# Patient Record
Sex: Female | Born: 1940
Health system: Southern US, Community
[De-identification: ages and names within clinical notes are randomized; demographics above are authoritative.]

## PROBLEM LIST (undated history)

## (undated) DIAGNOSIS — F0150 Vascular dementia without behavioral disturbance: Secondary | ICD-10-CM

---

## 2017-04-14 DIAGNOSIS — G301 Alzheimer's disease with late onset: Secondary | ICD-10-CM | POA: Diagnosis not present

## 2017-04-14 DIAGNOSIS — R69 Illness, unspecified: Secondary | ICD-10-CM | POA: Diagnosis not present

## 2017-05-24 DIAGNOSIS — R69 Illness, unspecified: Secondary | ICD-10-CM | POA: Diagnosis not present

## 2017-06-25 DIAGNOSIS — G301 Alzheimer's disease with late onset: Secondary | ICD-10-CM | POA: Diagnosis not present

## 2017-06-25 DIAGNOSIS — R69 Illness, unspecified: Secondary | ICD-10-CM | POA: Diagnosis not present

## 2017-06-25 DIAGNOSIS — Z23 Encounter for immunization: Secondary | ICD-10-CM | POA: Diagnosis not present

## 2017-06-25 DIAGNOSIS — M25571 Pain in right ankle and joints of right foot: Secondary | ICD-10-CM | POA: Diagnosis not present

## 2017-09-08 DIAGNOSIS — G301 Alzheimer's disease with late onset: Secondary | ICD-10-CM | POA: Diagnosis not present

## 2017-09-08 DIAGNOSIS — R69 Illness, unspecified: Secondary | ICD-10-CM | POA: Diagnosis not present

## 2017-10-08 DIAGNOSIS — R69 Illness, unspecified: Secondary | ICD-10-CM | POA: Diagnosis not present

## 2017-10-08 DIAGNOSIS — G301 Alzheimer's disease with late onset: Secondary | ICD-10-CM | POA: Diagnosis not present

## 2017-10-08 DIAGNOSIS — M2550 Pain in unspecified joint: Secondary | ICD-10-CM | POA: Diagnosis not present

## 2017-11-29 DIAGNOSIS — R69 Illness, unspecified: Secondary | ICD-10-CM | POA: Diagnosis not present

## 2018-03-21 DIAGNOSIS — Z9181 History of falling: Secondary | ICD-10-CM | POA: Diagnosis not present

## 2018-03-21 DIAGNOSIS — Z823 Family history of stroke: Secondary | ICD-10-CM | POA: Diagnosis not present

## 2018-03-21 DIAGNOSIS — G309 Alzheimer's disease, unspecified: Secondary | ICD-10-CM | POA: Diagnosis not present

## 2018-03-21 DIAGNOSIS — Z8249 Family history of ischemic heart disease and other diseases of the circulatory system: Secondary | ICD-10-CM | POA: Diagnosis not present

## 2018-03-21 DIAGNOSIS — R69 Illness, unspecified: Secondary | ICD-10-CM | POA: Diagnosis not present

## 2018-03-21 DIAGNOSIS — Z833 Family history of diabetes mellitus: Secondary | ICD-10-CM | POA: Diagnosis not present

## 2018-03-21 DIAGNOSIS — F0280 Dementia in other diseases classified elsewhere without behavioral disturbance: Secondary | ICD-10-CM | POA: Diagnosis not present

## 2018-03-21 DIAGNOSIS — F419 Anxiety disorder, unspecified: Secondary | ICD-10-CM | POA: Diagnosis not present

## 2018-03-21 DIAGNOSIS — R030 Elevated blood-pressure reading, without diagnosis of hypertension: Secondary | ICD-10-CM | POA: Diagnosis not present

## 2018-03-29 DIAGNOSIS — G300 Alzheimer's disease with early onset: Secondary | ICD-10-CM | POA: Diagnosis not present

## 2018-03-29 DIAGNOSIS — Z6826 Body mass index (BMI) 26.0-26.9, adult: Secondary | ICD-10-CM | POA: Diagnosis not present

## 2018-03-29 DIAGNOSIS — E782 Mixed hyperlipidemia: Secondary | ICD-10-CM | POA: Diagnosis not present

## 2018-04-04 DIAGNOSIS — Z6826 Body mass index (BMI) 26.0-26.9, adult: Secondary | ICD-10-CM | POA: Diagnosis not present

## 2018-04-04 DIAGNOSIS — G300 Alzheimer's disease with early onset: Secondary | ICD-10-CM | POA: Diagnosis not present

## 2018-04-04 DIAGNOSIS — N3001 Acute cystitis with hematuria: Secondary | ICD-10-CM | POA: Diagnosis not present

## 2018-04-04 DIAGNOSIS — N390 Urinary tract infection, site not specified: Secondary | ICD-10-CM | POA: Diagnosis not present

## 2018-05-05 DIAGNOSIS — G309 Alzheimer's disease, unspecified: Secondary | ICD-10-CM | POA: Diagnosis not present

## 2018-06-16 DIAGNOSIS — G309 Alzheimer's disease, unspecified: Secondary | ICD-10-CM | POA: Diagnosis not present

## 2018-07-13 DIAGNOSIS — R21 Rash and other nonspecific skin eruption: Secondary | ICD-10-CM | POA: Diagnosis not present

## 2018-07-13 DIAGNOSIS — Z23 Encounter for immunization: Secondary | ICD-10-CM | POA: Diagnosis not present

## 2018-07-13 DIAGNOSIS — Z6824 Body mass index (BMI) 24.0-24.9, adult: Secondary | ICD-10-CM | POA: Diagnosis not present

## 2018-07-29 DIAGNOSIS — G309 Alzheimer's disease, unspecified: Secondary | ICD-10-CM | POA: Diagnosis not present

## 2018-08-08 DIAGNOSIS — R69 Illness, unspecified: Secondary | ICD-10-CM | POA: Diagnosis not present

## 2018-11-02 DIAGNOSIS — G309 Alzheimer's disease, unspecified: Secondary | ICD-10-CM | POA: Diagnosis not present

## 2019-12-11 ENCOUNTER — Other Ambulatory Visit: Payer: Self-pay

## 2019-12-11 ENCOUNTER — Emergency Department (HOSPITAL_COMMUNITY)
Admission: EM | Admit: 2019-12-11 | Discharge: 2019-12-11 | Disposition: A | Discharge status: Home / Self Care | Payer: Medicare HMO | Attending: Emergency Medicine | Admitting: Emergency Medicine

## 2019-12-11 ENCOUNTER — Encounter (HOSPITAL_COMMUNITY): Payer: Self-pay | Admitting: Emergency Medicine

## 2019-12-11 DIAGNOSIS — R4689 Other symptoms and signs involving appearance and behavior: Secondary | ICD-10-CM | POA: Diagnosis present

## 2019-12-11 DIAGNOSIS — F0391 Unspecified dementia with behavioral disturbance: Secondary | ICD-10-CM | POA: Insufficient documentation

## 2019-12-11 LAB — URINALYSIS, ROUTINE W REFLEX MICROSCOPIC
Bilirubin Urine: NEGATIVE
Glucose, UA: NEGATIVE mg/dL
Hgb urine dipstick: NEGATIVE
Ketones, ur: NEGATIVE mg/dL
Leukocytes,Ua: NEGATIVE
Nitrite: NEGATIVE
Protein, ur: NEGATIVE mg/dL
Specific Gravity, Urine: 1.01 (ref 1.005–1.030)
pH: 7 (ref 5.0–8.0)

## 2019-12-11 LAB — CBC
HCT: 43.9 % (ref 36.0–46.0)
Hemoglobin: 13.9 g/dL (ref 12.0–15.0)
MCH: 29.2 pg (ref 26.0–34.0)
MCHC: 31.7 g/dL (ref 30.0–36.0)
MCV: 92.2 fL (ref 80.0–100.0)
Platelets: 210 10*3/uL (ref 150–400)
RBC: 4.76 MIL/uL (ref 3.87–5.11)
RDW: 12.9 % (ref 11.5–15.5)
WBC: 6.1 10*3/uL (ref 4.0–10.5)
nRBC: 0 % (ref 0.0–0.2)

## 2019-12-11 LAB — BASIC METABOLIC PANEL
Anion gap: 8 (ref 5–15)
BUN: 11 mg/dL (ref 8–23)
CO2: 27 mmol/L (ref 22–32)
Calcium: 8.9 mg/dL (ref 8.9–10.3)
Chloride: 105 mmol/L (ref 98–111)
Creatinine, Ser: 0.85 mg/dL (ref 0.44–1.00)
GFR calc Af Amer: >60 mL/min (ref >60)
GFR calc non Af Amer: >60 mL/min (ref >60)
Glucose, Bld: 102 mg/dL — ABNORMAL HIGH (ref 70–99)
Potassium: 4.2 mmol/L (ref 3.5–5.1)
Sodium: 140 mmol/L (ref 135–145)

## 2019-12-11 NOTE — ED Notes (Signed)
Pt without complaints at this time. Will continue to monitor.

## 2019-12-11 NOTE — ED Notes (Signed)
Pt ambulated back to bed with steady gait.

## 2019-12-11 NOTE — ED Notes (Signed)
PTAR arrived to get patient

## 2019-12-11 NOTE — ED Notes (Signed)
Pt provided dinner tray.

## 2019-12-11 NOTE — Progress Notes (Signed)
CSW spoke to EDP who states pt is from [facility] and is now calm, resting and ready for D/C.  CSW spoke to pt's RN who states she will be calling report shortly.  CSW will continue to follow for D/C needs.  Alphonse Guild. Tammara Massing, LCSW, LCAS, CSI Transitions of Care Clinical Social Worker Care Coordination Department Ph: [phone]

## 2019-12-11 NOTE — ED Notes (Signed)
Update provided to nursing home.

## 2019-12-11 NOTE — Discharge Instructions (Signed)
It was our pleasure to provide your ER care today - we hope that you feel better.  Follow up with primary care doctor in the next couple days - discuss possible medication adjustment.   Return to ER if worse, new symptoms, fevers, trouble breathing, or other medical emergency.

## 2019-12-11 NOTE — ED Provider Notes (Signed)
[facility] DEPT Provider Note   CSN: [id number] Arrival date & time: 12/11/19  1514     History Chief Complaint  Patient presents with  . Aggressive Behavior  . Dementia    Tracy Jenkins is a 79 y.o. female.  Patient from [facility] via EMS, pt with hx dementia, was noted with agitated/aggressive behavior earlier. Currently patient is calm and cooperative. Patient very limited historian, advanced dementia - level 5 caveat. Patient denies any c/o currently. No pain or discomfort. Denies being depressed, angry or upset. Denies thoughts of harm to self or others.   The history is provided by the patient and the EMS personnel. The history is limited by the condition of the patient.       History reviewed. No pertinent past medical history.  There are no problems to display for this patient.   History reviewed. No pertinent surgical history.   OB History   No obstetric history on file.     History reviewed. No pertinent family history.  Social History   Tobacco Use  . Smoking status: Not on file  Substance Use Topics  . Alcohol use: Not on file  . Drug use: Not on file    Home Medications Prior to Admission medications   Not on File    Allergies    Patient has no allergy information on record.  Review of Systems   Review of Systems  Constitutional: Negative for fever.  HENT: Negative for sore throat.   Eyes: Negative for visual disturbance.  Respiratory: Negative for shortness of breath.   Cardiovascular: Negative for chest pain.  Gastrointestinal: Negative for abdominal pain.  Genitourinary: Negative for dysuria.  Musculoskeletal: Negative for back pain and neck pain.  Skin: Negative for rash.  Neurological: Negative for headaches.  Hematological: Does not bruise/bleed easily.  Psychiatric/Behavioral: Positive for confusion.    Physical Exam Updated Vital Signs BP (!) 146/62   Pulse 76   Temp 98.4 F (36.9 C) (Oral)   Resp  15   SpO2 96%   Physical Exam Vitals and nursing note reviewed.  Constitutional:      Appearance: Normal appearance. She is well-developed.  HENT:     Head: Atraumatic.     Nose: Nose normal.     Mouth/Throat:     Mouth: Mucous membranes are moist.  Eyes:     General: No scleral icterus.    Conjunctiva/sclera: Conjunctivae normal.     Pupils: Pupils are equal, round, and reactive to light.  Neck:     Trachea: No tracheal deviation.  Cardiovascular:     Rate and Rhythm: Normal rate and regular rhythm.     Pulses: Normal pulses.     Heart sounds: Normal heart sounds. No murmur. No friction rub. No gallop.   Pulmonary:     Effort: Pulmonary effort is normal. No respiratory distress.     Breath sounds: Normal breath sounds.  Abdominal:     General: Bowel sounds are normal. There is no distension.     Palpations: Abdomen is soft.     Tenderness: There is no abdominal tenderness. There is no guarding.  Genitourinary:    Comments: No cva tenderness.  Musculoskeletal:        General: No swelling.     Cervical back: Normal range of motion and neck supple. No rigidity. No muscular tenderness.  Skin:    General: Skin is warm and dry.     Findings: No rash.  Neurological:  Mental Status: She is alert.     Comments: Alert, speech normal. Motor/sens grossly intact bil.   Psychiatric:        Mood and Affect: Mood normal.     Comments: Smiling, alert, cooperative.      ED Results / Procedures / Treatments   Labs (all labs ordered are listed, but only abnormal results are displayed) Results for orders placed or performed during the hospital encounter of 12/11/19  CBC  Result Value Ref Range   WBC 6.1 4.0 - 10.5 K/uL   RBC 4.76 3.87 - 5.11 MIL/uL   Hemoglobin 13.9 12.0 - 15.0 g/dL   HCT 43.9 36.0 - 46.0 %   MCV 92.2 80.0 - 100.0 fL   MCH 29.2 26.0 - 34.0 pg   MCHC 31.7 30.0 - 36.0 g/dL   RDW 12.9 11.5 - 15.5 %   Platelets 210 150 - 400 K/uL   nRBC 0.0 0.0 - 0.2 %  Basic  metabolic panel  Result Value Ref Range   Sodium 140 135 - 145 mmol/L   Potassium 4.2 3.5 - 5.1 mmol/L   Chloride 105 98 - 111 mmol/L   CO2 27 22 - 32 mmol/L   Glucose, Bld 102 (H) 70 - 99 mg/dL   BUN 11 8 - 23 mg/dL   Creatinine, Ser 0.85 0.44 - 1.00 mg/dL   Calcium 8.9 8.9 - 10.3 mg/dL   GFR calc non Af Amer >60 >60 mL/min   GFR calc Af Amer >60 >60 mL/min   Anion gap 8 5 - 15  Urinalysis, Routine w reflex microscopic  Result Value Ref Range   Color, Urine STRAW (A) YELLOW   APPearance CLEAR CLEAR   Specific Gravity, Urine 1.010 1.005 - 1.030   pH 7.0 5.0 - 8.0   Glucose, UA NEGATIVE NEGATIVE mg/dL   Hgb urine dipstick NEGATIVE NEGATIVE   Bilirubin Urine NEGATIVE NEGATIVE   Ketones, ur NEGATIVE NEGATIVE mg/dL   Protein, ur NEGATIVE NEGATIVE mg/dL   Nitrite NEGATIVE NEGATIVE   Leukocytes,Ua NEGATIVE NEGATIVE    EKG None  Radiology No results found.  Procedures Procedures (including critical care time)  Medications Ordered in ED Medications - No data to display  ED Course  I have reviewed the triage vital signs and the nursing notes.  Pertinent labs & imaging results that were available during my care of the patient were reviewed by me and considered in my medical decision making (see chart for details).    MDM Rules/Calculators/A&P                      Labs sent.   Reviewed nursing notes and prior charts for additional history.   Suspect patient with episodic behavior disturbance(s) related to chronic dementia. Patient remains alert, content, and cooperative in ED.   Po fluids. Tolerates well. No new c/o.  Labs reviewed/interpreted by me - chem normal. UA neg for infection.   Patient remains calm, smiling, alert, no aggressive behavior in ED.   Patient appears stable for d/c back to ECF.        Final Clinical Impression(s) / ED Diagnoses Final diagnoses:  None    Rx / DC Orders ED Discharge Orders    None       Lajean Saver,  MD 12/11/19 1656

## 2019-12-11 NOTE — ED Notes (Signed)
Pt ambulated to BR; gait steady

## 2019-12-11 NOTE — ED Notes (Signed)
PTAR notified of need for pt transport.  Dispatch reported that there were "9-10" ahead of pt to be picked up for transport.
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
## 2020-03-08 ENCOUNTER — Emergency Department (HOSPITAL_COMMUNITY): Payer: Medicare HMO

## 2020-03-08 ENCOUNTER — Encounter (HOSPITAL_COMMUNITY): Payer: Self-pay | Admitting: *Deleted

## 2020-03-08 ENCOUNTER — Other Ambulatory Visit: Payer: Self-pay

## 2020-03-08 ENCOUNTER — Emergency Department (HOSPITAL_COMMUNITY)
Admission: EM | Admit: 2020-03-08 | Discharge: 2020-03-08 | Disposition: A | Discharge status: Home / Self Care | Payer: Medicare HMO | Attending: Emergency Medicine | Admitting: Emergency Medicine

## 2020-03-08 DIAGNOSIS — F0151 Vascular dementia with behavioral disturbance: Secondary | ICD-10-CM | POA: Insufficient documentation

## 2020-03-08 DIAGNOSIS — W19XXXA Unspecified fall, initial encounter: Secondary | ICD-10-CM | POA: Diagnosis not present

## 2020-03-08 DIAGNOSIS — Y999 Unspecified external cause status: Secondary | ICD-10-CM | POA: Diagnosis not present

## 2020-03-08 DIAGNOSIS — S161XXA Strain of muscle, fascia and tendon at neck level, initial encounter: Secondary | ICD-10-CM

## 2020-03-08 DIAGNOSIS — Y939 Activity, unspecified: Secondary | ICD-10-CM | POA: Insufficient documentation

## 2020-03-08 DIAGNOSIS — S0990XA Unspecified injury of head, initial encounter: Secondary | ICD-10-CM

## 2020-03-08 DIAGNOSIS — Z043 Encounter for examination and observation following other accident: Secondary | ICD-10-CM | POA: Insufficient documentation

## 2020-03-08 DIAGNOSIS — Y9289 Other specified places as the place of occurrence of the external cause: Secondary | ICD-10-CM | POA: Diagnosis not present

## 2020-03-08 HISTORY — DX: Vascular dementia, unspecified severity, without behavioral disturbance, psychotic disturbance, mood disturbance, and anxiety: F01.50

## 2020-03-08 NOTE — ED Provider Notes (Signed)
[facility] DEPT Provider Note   CSN: [id number] Arrival date & time: 03/08/20  0140     History Chief Complaint  Patient presents with  . Fall    Tracy Jenkins is a 79 y.o. female.  Patient is a 79 year old female with history of dementia presenting with complaints of fall.  Patient lives in a memory care center.  She was found today on the floor after what appears to be an unwitnessed fall.  Patient has very little history secondary to dementia.  The history is provided by the patient.  Fall This is a new problem. The current episode started less than 1 hour ago. The problem occurs constantly. The problem has not changed since onset.      Past Medical History:  Diagnosis Date  . 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   No past surgical history on file.   OB History   No obstetric history on file.     No family history on file.  Social History   Tobacco Use  . Smoking status: Not on file  Substance Use Topics  . Alcohol use: Not on file  . Drug use: Not on file    Home Medications Prior to Admission medications   Not on File    Allergies    Patient has no known allergies.  Review of Systems   Review of Systems  Unable to perform ROS: Dementia    Physical Exam Updated Vital Signs BP 134/75 (BP Location: Right Arm)   Pulse (!) 56   Temp 98.1 F (36.7 C) (Oral)   Resp 20   SpO2 98%   Physical Exam Vitals and nursing note reviewed.  Constitutional:      General: She is not in acute distress.    Appearance: She is well-developed. She is not diaphoretic.  HENT:     Head: Normocephalic and atraumatic.  Cardiovascular:     Rate and Rhythm: Normal rate and regular rhythm.     Heart sounds: No murmur heard.  No friction rub. No gallop.   Pulmonary:     Effort: Pulmonary effort is normal. No respiratory distress.     Breath sounds: Normal breath sounds. No wheezing.    Abdominal:     General: Bowel sounds are normal. There is no distension.     Palpations: Abdomen is soft.     Tenderness: There is no abdominal tenderness.  Musculoskeletal:        General: Normal range of motion.     Cervical back: Normal range of motion and neck supple.     Comments: She has full range of motion of all 4 extremities.  There is no pain with palpation of the pelvis or rotation of the hips.  Skin:    General: Skin is warm and dry.  Neurological:     General: No focal deficit present.     Mental Status: She is alert and oriented to person, place, and time.     Cranial Nerves: No cranial nerve deficit.     Sensory: No sensory deficit.     Motor: No weakness.     Coordination: Coordination normal.     ED Results / Procedures / Treatments   Labs (all labs ordered are listed, but only abnormal results are displayed) Labs Reviewed - No data to display  EKG None  Radiology No results found.  Procedures Procedures (including critical care time)  Medications  Ordered in ED Medications - No data to display  ED Course  I have reviewed the triage vital signs and the nursing notes.  Pertinent labs & imaging results that were available during my care of the patient were reviewed by me and considered in my medical decision making (see chart for details).    MDM Rules/Calculators/A&P  Patient presents here ambulance after a fall.  She was found on the floor of her memory care center with a bump on her head.  She appears confused, but I believe this to be her baseline.  She otherwise appears neurologically intact.  Head CT and cervical spine CT both negative.  At this point patient seems appropriate for discharge.  Final Clinical Impression(s) / ED Diagnoses Final diagnoses:  None    Rx / DC Orders ED Discharge Orders    None       Veryl Speak, MD 03/08/20 [phone]

## 2020-03-08 NOTE — Discharge Instructions (Addendum)
Continue medications as previously prescribed.

## 2020-03-08 NOTE — ED Notes (Signed)
PTAR notified for transport 

## 2020-03-08 NOTE — ED Triage Notes (Signed)
Pt arrives from [facility] facility where she sustained an unwitnessed fall. Found on ground by staff in restroom. Pt has hematoma to posterior left side of the head, c/o upper back pain. C collar placed for transport. Hx of dementia, at baseline per staff. VSS en route.

## 2020-05-15 ENCOUNTER — Encounter (HOSPITAL_COMMUNITY): Payer: Self-pay

## 2020-05-15 ENCOUNTER — Other Ambulatory Visit: Payer: Self-pay

## 2020-05-15 DIAGNOSIS — F0390 Unspecified dementia without behavioral disturbance: Secondary | ICD-10-CM | POA: Diagnosis not present

## 2020-05-15 DIAGNOSIS — Z79899 Other long term (current) drug therapy: Secondary | ICD-10-CM | POA: Diagnosis not present

## 2020-05-15 DIAGNOSIS — E860 Dehydration: Secondary | ICD-10-CM | POA: Insufficient documentation

## 2020-05-15 DIAGNOSIS — R4182 Altered mental status, unspecified: Secondary | ICD-10-CM | POA: Diagnosis present

## 2020-05-15 DIAGNOSIS — Z20822 Contact with and (suspected) exposure to covid-19: Secondary | ICD-10-CM | POA: Insufficient documentation

## 2020-05-15 NOTE — ED Triage Notes (Signed)
Pt BIB [facility] from [facility] c/o weakness and AMS since Monday per staff. They state that she has been eating and drinking less and not communicating as much as normal. Hx dementia. No focal neuro symptoms.

## 2020-05-16 ENCOUNTER — Emergency Department (HOSPITAL_COMMUNITY): Payer: Medicare HMO

## 2020-05-16 ENCOUNTER — Emergency Department (HOSPITAL_COMMUNITY)
Admission: EM | Admit: 2020-05-16 | Discharge: 2020-05-16 | Disposition: A | Discharge status: Skilled Nursing Facility | Payer: Medicare HMO | Attending: Emergency Medicine | Admitting: Emergency Medicine

## 2020-05-16 DIAGNOSIS — F0390 Unspecified dementia without behavioral disturbance: Secondary | ICD-10-CM | POA: Diagnosis not present

## 2020-05-16 DIAGNOSIS — E860 Dehydration: Secondary | ICD-10-CM

## 2020-05-16 LAB — VALPROIC ACID LEVEL: Valproic Acid Lvl: 40 ug/mL — ABNORMAL LOW (ref 50.0–100.0)

## 2020-05-16 LAB — COMPREHENSIVE METABOLIC PANEL
ALT: 50 U/L — ABNORMAL HIGH (ref 0–44)
AST: 46 U/L — ABNORMAL HIGH (ref 15–41)
Albumin: 3.5 g/dL (ref 3.5–5.0)
Alkaline Phosphatase: 38 U/L (ref 38–126)
Anion gap: 9 (ref 5–15)
BUN: 15 mg/dL (ref 8–23)
CO2: 32 mmol/L (ref 22–32)
Calcium: 9.1 mg/dL (ref 8.9–10.3)
Chloride: 102 mmol/L (ref 98–111)
Creatinine, Ser: 0.73 mg/dL (ref 0.44–1.00)
GFR calc Af Amer: >60 mL/min (ref >60)
GFR calc non Af Amer: >60 mL/min (ref >60)
Glucose, Bld: 106 mg/dL — ABNORMAL HIGH (ref 70–99)
Potassium: 3.5 mmol/L (ref 3.5–5.1)
Sodium: 143 mmol/L (ref 135–145)
Total Bilirubin: 0.5 mg/dL (ref 0.3–1.2)
Total Protein: 6.4 g/dL — ABNORMAL LOW (ref 6.5–8.1)

## 2020-05-16 LAB — URINALYSIS, ROUTINE W REFLEX MICROSCOPIC
Bilirubin Urine: NEGATIVE
Glucose, UA: NEGATIVE mg/dL
Hgb urine dipstick: NEGATIVE
Ketones, ur: 5 mg/dL — AB
Leukocytes,Ua: NEGATIVE
Nitrite: NEGATIVE
Protein, ur: NEGATIVE mg/dL
Specific Gravity, Urine: 1.024 (ref 1.005–1.030)
pH: 5 (ref 5.0–8.0)

## 2020-05-16 LAB — CBC
HCT: 43.7 % (ref 36.0–46.0)
Hemoglobin: 14.1 g/dL (ref 12.0–15.0)
MCH: 30.3 pg (ref 26.0–34.0)
MCHC: 32.3 g/dL (ref 30.0–36.0)
MCV: 93.8 fL (ref 80.0–100.0)
Platelets: 141 10*3/uL — ABNORMAL LOW (ref 150–400)
RBC: 4.66 MIL/uL (ref 3.87–5.11)
RDW: 13.3 % (ref 11.5–15.5)
WBC: 5.1 10*3/uL (ref 4.0–10.5)
nRBC: 0 % (ref 0.0–0.2)

## 2020-05-16 LAB — SARS CORONAVIRUS 2 BY RT PCR (HOSPITAL ORDER, PERFORMED IN ~~LOC~~ HOSPITAL LAB): SARS Coronavirus 2: NEGATIVE

## 2020-05-16 LAB — CBG MONITORING, ED: Glucose-Capillary: 102 mg/dL — ABNORMAL HIGH (ref 70–99)

## 2020-05-16 MED ORDER — SODIUM CHLORIDE 0.9 % IV BOLUS
1000.0000 mL | Freq: Once | INTRAVENOUS | Status: AC
  Administered 2020-05-16: 1000 mL via INTRAVENOUS

## 2020-05-16 NOTE — ED Notes (Signed)
Pt resting comfortably in her triage room

## 2020-05-16 NOTE — ED Notes (Signed)
Attempted to call next of kin. Mr. Tracy Jenkins that patient is being discharged. Voicemail left

## 2020-05-16 NOTE — ED Notes (Signed)
PTAR called  

## 2020-05-16 NOTE — ED Notes (Signed)
Patient assisted to bathroom 

## 2020-05-16 NOTE — ED Provider Notes (Signed)
[facility] DEPT Provider Note   CSN: [id number] Arrival date & time: 05/15/20  2347     History Chief Complaint  Patient presents with  . Altered Mental Status    Tracy Jenkins is a 79 y.o. female.  Pt presents to the ED today from [facility] for [place].  Pt has dementia and said she feels fine.  Per EMS, pt has not been eating and drinking well for the past few days.         Past Medical History:  Diagnosis Date  . 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   History reviewed. No pertinent surgical history.   OB History   No obstetric history on file.     History reviewed. No pertinent family history.  Social History   Tobacco Use  . Smoking status: Not on file  Substance Use Topics  . Alcohol use: Not on file  . Drug use: Not on file    Home Medications Prior to Admission medications   Medication Sig Start Date End Date Taking? Authorizing Provider  acetaminophen (TYLENOL) 500 MG tablet Take 500 mg by mouth every 6 (six) hours as needed for mild pain, fever or headache.   Yes [provider]  alum & mag hydroxide-simeth (MI-ACID) 200-200-20 MG/5ML suspension Take 30 mLs by mouth every 6 (six) hours as needed for indigestion or heartburn.   Yes [provider]  citalopram (CELEXA) 10 MG tablet Take 10 mg by mouth daily.   Yes [provider]  divalproex (DEPAKOTE SPRINKLE) 125 MG capsule Take 250 mg by mouth 3 (three) times daily.   Yes [provider]  guaifenesin (ROBITUSSIN) 100 MG/5ML syrup Take 200 mg by mouth every 6 (six) hours as needed for cough.   Yes [provider]  loperamide (IMODIUM) 2 MG capsule Take 2 mg by mouth as needed for diarrhea or loose stools. Not to exceed 8 doses in 24 hours   Yes [provider]  magnesium hydroxide (MILK OF MAGNESIA) 400 MG/5ML suspension Take 30 mLs by mouth at bedtime as needed for mild  constipation.   Yes [provider]  neomycin-bacitracin-polymyxin (NEOSPORIN) ointment Apply 1 application topically as needed for wound care.   Yes [provider]  QUEtiapine (SEROQUEL) 25 MG tablet Take 25 mg by mouth 2 (two) times daily.   Yes [provider]  traZODone (DESYREL) 50 MG tablet Take 50 mg by mouth at bedtime.   Yes [provider]    Allergies    Patient has no known allergies.  Review of Systems   Review of Systems  Unable to perform ROS: Dementia  All other systems reviewed and are negative.   Physical Exam Updated Vital Signs BP (!) 141/66 (BP Location: Left Arm)   Pulse 63   Temp 97.8 F (36.6 C) (Oral)   Resp 16   SpO2 97%   Physical Exam Vitals and nursing note reviewed.  Constitutional:      Appearance: Normal appearance.  HENT:     Head: Normocephalic and atraumatic.     Right Ear: External ear normal.     Left Ear: External ear normal.     Nose: Nose normal.     Mouth/Throat:     Mouth: Mucous membranes are moist.     Pharynx: Oropharynx is clear.  Eyes:     Extraocular Movements: Extraocular movements intact.     Conjunctiva/sclera: Conjunctivae normal.  Pupils: Pupils are equal, round, and reactive to light.  Cardiovascular:     Rate and Rhythm: Normal rate and regular rhythm.     Pulses: Normal pulses.     Heart sounds: Normal heart sounds.  Pulmonary:     Effort: Pulmonary effort is normal.     Breath sounds: Normal breath sounds.  Abdominal:     General: Abdomen is flat. Bowel sounds are normal.     Palpations: Abdomen is soft.  Musculoskeletal:        General: Normal range of motion.     Cervical back: Normal range of motion and neck supple.  Skin:    General: Skin is warm.     Capillary Refill: Capillary refill takes less than 2 seconds.  Neurological:     General: No focal deficit present.     Mental Status: She is alert.  Psychiatric:        Mood and Affect: Mood normal.         Behavior: Behavior normal.     ED Results / Procedures / Treatments   Labs (all labs ordered are listed, but only abnormal results are displayed) Labs Reviewed  COMPREHENSIVE METABOLIC PANEL - Abnormal; Notable for the following components:      Result Value   Glucose, Bld 106 (*)    Total Protein 6.4 (*)    AST 46 (*)    ALT 50 (*)    All other components within normal limits  CBC - Abnormal; Notable for the following components:   Platelets 141 (*)    All other components within normal limits  URINALYSIS, ROUTINE W REFLEX MICROSCOPIC - Abnormal; Notable for the following components:   Color, Urine AMBER (*)    Ketones, ur 5 (*)    All other components within normal limits  CBG MONITORING, ED - Abnormal; Notable for the following components:   Glucose-Capillary 102 (*)    All other components within normal limits  SARS CORONAVIRUS 2 BY RT PCR (HOSPITAL ORDER, [facility])  VALPROIC ACID LEVEL    EKG None  Radiology DG Chest 2 View  Result Date: 05/16/2020 CLINICAL DATA:  Weakness and altered mental status EXAM: CHEST - 2 VIEW COMPARISON:  None FINDINGS: Images rotated to the RIGHT. Accounting for this rotation cardiomediastinal contours and hilar structures are normal. Lungs are clear.  No sign of pleural effusion. Spinal degenerative changes, limited skeletal assessment otherwise unremarkable. IMPRESSION: No active cardiopulmonary disease. Electronically Signed   By: Zetta Bills M.D.   On: 05/16/2020 08:16   CT Head Wo Contrast  Result Date: 05/16/2020 CLINICAL DATA:  Mental status change with unknown cause EXAM: CT HEAD WITHOUT CONTRAST TECHNIQUE: Contiguous axial images were obtained from the base of the skull through the vertex without intravenous contrast. COMPARISON:  03/08/2020 FINDINGS: Brain: No evidence of acute infarction, hemorrhage, hydrocephalus, extra-axial collection or mass lesion/mass effect. Confluent chronic small vessel  ischemic gliosis in the cerebral white matter. Cerebral volume loss that is prominent, especially at the medial temporal lobes, worse on the left. Vascular: No hyperdense vessel or unexpected calcification. Skull: Normal. Negative for fracture or focal lesion. Sinuses/Orbits: Negative IMPRESSION: 1. No acute or reversible finding. 2. Prominent brain atrophy and extensive chronic white matter disease. Electronically Signed   By: Monte Fantasia M.D.   On: 05/16/2020 07:49    Procedures Procedures (including critical care time)  Medications Ordered in ED Medications  sodium chloride 0.9 % bolus 1,000 mL (1,000 mLs  Intravenous New Bag/Given 05/16/20 0813)    ED Course  I have reviewed the triage vital signs and the nursing notes.  Pertinent labs & imaging results that were available during my care of the patient were reviewed by me and considered in my medical decision making (see chart for details).    MDM Rules/Calculators/A&P                          Pt is ambulatory in the ED.  She has no complaints.  She is given IVFs.  She is drinking fluids if I hold them up to her.  Pt d/w her daughter.    Pt is stable for d/c.  Return if worse.    Final Clinical Impression(s) / ED Diagnoses Final diagnoses:  Dehydration  Dementia without behavioral disturbance, unspecified dementia type [facility])    Rx / [place] Orders ED Discharge Orders    None       Isla Pence, MD 05/16/20 [phone]

## 2020-05-27 ENCOUNTER — Emergency Department (HOSPITAL_COMMUNITY): Payer: Medicare HMO

## 2020-05-27 ENCOUNTER — Emergency Department (HOSPITAL_COMMUNITY)
Admission: EM | Admit: 2020-05-27 | Discharge: 2020-05-28 | Disposition: A | Discharge status: Home / Self Care | Payer: Medicare HMO | Attending: Emergency Medicine | Admitting: Emergency Medicine

## 2020-05-27 ENCOUNTER — Other Ambulatory Visit: Payer: Self-pay

## 2020-05-27 ENCOUNTER — Encounter (HOSPITAL_COMMUNITY): Payer: Self-pay

## 2020-05-27 DIAGNOSIS — Y999 Unspecified external cause status: Secondary | ICD-10-CM | POA: Insufficient documentation

## 2020-05-27 DIAGNOSIS — Y939 Activity, unspecified: Secondary | ICD-10-CM | POA: Insufficient documentation

## 2020-05-27 DIAGNOSIS — Y929 Unspecified place or not applicable: Secondary | ICD-10-CM | POA: Insufficient documentation

## 2020-05-27 DIAGNOSIS — W19XXXA Unspecified fall, initial encounter: Secondary | ICD-10-CM | POA: Diagnosis not present

## 2020-05-27 DIAGNOSIS — S0990XA Unspecified injury of head, initial encounter: Secondary | ICD-10-CM | POA: Insufficient documentation

## 2020-05-27 DIAGNOSIS — Z79899 Other long term (current) drug therapy: Secondary | ICD-10-CM | POA: Insufficient documentation

## 2020-05-27 DIAGNOSIS — F0150 Vascular dementia without behavioral disturbance: Secondary | ICD-10-CM | POA: Insufficient documentation

## 2020-05-27 NOTE — Discharge Instructions (Signed)
You were seen in the emergency department for evaluation of injuries after a fall.  You did not have any obvious sign of injury.  You had a head and cervical spine CT that did not show any obvious injuries.  Please monitor back to your facility for any neurologic symptoms.  Return to the emergency department for any concerns.

## 2020-05-27 NOTE — ED Triage Notes (Signed)
Pt BIB [facility] from [facility]. Staff reports a fall where she hit her head around 7a. No LOC. Pt has advanced dementia and is at baseline. No injury noted. VSS.

## 2020-05-27 NOTE — ED Provider Notes (Signed)
[facility] DEPT Provider Note   CSN: [id number] Arrival date & time: 05/27/20  2136     History Chief Complaint  Patient presents with  . Fall    Tracy Jenkins is a 79 y.o. female.  Level 5 caveat secondary to dementia.  Patient is brought in by EMS from [facility] her memory facility for evaluation of a fall.  They said she hit her head around 7 AM this morning.  No loss of consciousness.  EMS states she is at baseline mental status.  No signs of injury.  Denies complaints although is a limited historian.  The history is provided by the EMS personnel.  Fall This is a recurrent problem. The current episode started 6 to 12 hours ago. The problem has not changed since onset.Pertinent negatives include no chest pain, no abdominal pain, no headaches and no shortness of breath. Nothing aggravates the symptoms. Nothing relieves the symptoms. She has tried nothing for the symptoms. The treatment provided no relief.       Past Medical History:  Diagnosis Date  . 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   History reviewed. No pertinent surgical history.   OB History   No obstetric history on file.     History reviewed. No pertinent family history.  Social History   Tobacco Use  . Smoking status: Not on file  Substance Use Topics  . Alcohol use: Not on file  . Drug use: Not on file    Home Medications Prior to Admission medications   Medication Sig Start Date End Date Taking? Authorizing Provider  acetaminophen (TYLENOL) 500 MG tablet Take 500 mg by mouth every 6 (six) hours as needed for mild pain, fever or headache.    [provider]  alum & mag hydroxide-simeth (MI-ACID) 200-200-20 MG/5ML suspension Take 30 mLs by mouth every 6 (six) hours as needed for indigestion or heartburn.    [provider]  citalopram (CELEXA) 10 MG tablet Take 10 mg by mouth daily.    [provider]  divalproex (DEPAKOTE SPRINKLE) 125 MG capsule Take 250 mg by mouth 3 (three) times daily.    [provider]  guaifenesin (ROBITUSSIN) 100 MG/5ML syrup Take 200 mg by mouth every 6 (six) hours as needed for cough.    [provider]  loperamide (IMODIUM) 2 MG capsule Take 2 mg by mouth as needed for diarrhea or loose stools. Not to exceed 8 doses in 24 hours    [provider]  magnesium hydroxide (MILK OF MAGNESIA) 400 MG/5ML suspension Take 30 mLs by mouth at bedtime as needed for mild constipation.    [provider]  neomycin-bacitracin-polymyxin (NEOSPORIN) ointment Apply 1 application topically as needed for wound care.    [provider]  QUEtiapine (SEROQUEL) 25 MG tablet Take 25 mg by mouth 2 (two) times daily.    [provider]  traZODone (DESYREL) 50 MG tablet Take 50 mg by mouth at bedtime.    [provider]    Allergies    Patient has no known allergies.  Review of Systems   Review of Systems  Unable to perform ROS: Dementia  Respiratory: Negative for shortness of breath.   Cardiovascular: Negative for chest pain.  Gastrointestinal: Negative for abdominal pain.  Neurological: Negative for headaches.    Physical Exam Updated Vital Signs BP 119/62   Pulse (!) 54   Temp 98.1 F (36.7  C)   Resp 20   SpO2 94%   Physical Exam Vitals and nursing note reviewed.  Constitutional:      General: She is not in acute distress.    Appearance: Normal appearance. She is well-developed.  HENT:     Head: Normocephalic and atraumatic.     Comments: No palpable depressions or lacerations noted. Eyes:     Conjunctiva/sclera: Conjunctivae normal.  Cardiovascular:     Rate and Rhythm: Normal rate and regular rhythm.     Pulses: Normal pulses.     Heart sounds: No murmur heard.   Pulmonary:     Effort: Pulmonary effort is normal. No respiratory distress.     Breath sounds: Normal breath sounds.    Abdominal:     Palpations: Abdomen is soft.     Tenderness: There is no abdominal tenderness.  Musculoskeletal:        General: Normal range of motion.     Cervical back: Neck supple. No tenderness.     Comments: She has full range of motion of her upper and lower extremities without any pain or limitations.  No obvious pelvic instability.  No focal cervical thoracic or lumbar spine tenderness.  Skin:    General: Skin is warm and dry.  Neurological:     General: No focal deficit present.     Mental Status: She is alert.     Comments: Patient is arousable to voice and will answer some simple questions.  No obvious facial asymmetry and moving all extremities.     ED Results / Procedures / Treatments   Labs (all labs ordered are listed, but only abnormal results are displayed) Labs Reviewed  CBC WITH DIFFERENTIAL/PLATELET - Abnormal; Notable for the following components:      Result Value   Platelets 142 (*)    All other components within normal limits  BASIC METABOLIC PANEL - Abnormal; Notable for the following components:   Potassium 3.4 (*)    Glucose, Bld 103 (*)    Calcium 8.3 (*)    All other components within normal limits  URINALYSIS, ROUTINE W REFLEX MICROSCOPIC - Abnormal; Notable for the following components:   Color, Urine AMBER (*)    Ketones, ur 5 (*)    All other components within normal limits    EKG EKG Interpretation  Date/Time:  Tuesday May 28 2020 01:09:23 EDT Ventricular Rate:  54 PR Interval:  144 QRS Duration: 92 QT Interval:  458 QTC Calculation: 434 R Axis:   6 Text Interpretation: Sinus bradycardia Possible Anterior infarct , age undetermined Abnormal ECG No prior for comparison Confirmed by Thayer Jew [phone]) on 05/28/2020 2:03:24 AM   Radiology CT Head Wo Contrast  Result Date: 05/27/2020 CLINICAL DATA:  [place] and hit head baseline dementia EXAM: CT HEAD WITHOUT CONTRAST CT CERVICAL SPINE WITHOUT CONTRAST TECHNIQUE: Multidetector CT  imaging of the head and cervical spine was performed following the standard protocol without intravenous contrast. Multiplanar CT image reconstructions of the cervical spine were also generated. COMPARISON:  CT brain 05/16/2020, CT cervical spine 03/08/2020 FINDINGS: CT HEAD FINDINGS Brain: No acute territorial infarction, hemorrhage, or intracranial mass. Moderate atrophy. Marked hypodensity in the white matter consistent with chronic small vessel ischemic change. Stable ventricle size. Vascular: No hyperdense vessels. Scattered carotid vascular calcification Skull: Normal. Negative for fracture or focal lesion. Sinuses/Orbits: No acute finding. Other: None CT CERVICAL SPINE FINDINGS Alignment: Straightening of the cervical spine. No subluxation. Facet alignment is maintained. Skull base and vertebrae: No  acute fracture. No primary bone lesion or focal pathologic process. Soft tissues and spinal canal: No prevertebral fluid or swelling. No visible canal hematoma. Disc levels: Degenerative changes at multiple levels. There is advanced degenerative change at C5-C6. Multiple level facet degenerative change with foraminal stenosis. Upper chest: Negative. Other: None IMPRESSION: 1. No CT evidence for acute intracranial abnormality. Atrophy and chronic small vessel ischemic change of the white matter. 2. Straightening of the cervical spine with degenerative changes. No acute osseous abnormality. Electronically Signed   By: Donavan Foil M.D.   On: 05/27/2020 23:32   CT Cervical Spine Wo Contrast  Result Date: 05/27/2020 CLINICAL DATA:  [place] and hit head baseline dementia EXAM: CT HEAD WITHOUT CONTRAST CT CERVICAL SPINE WITHOUT CONTRAST TECHNIQUE: Multidetector CT imaging of the head and cervical spine was performed following the standard protocol without intravenous contrast. Multiplanar CT image reconstructions of the cervical spine were also generated. COMPARISON:  CT brain 05/16/2020, CT cervical spine 03/08/2020  FINDINGS: CT HEAD FINDINGS Brain: No acute territorial infarction, hemorrhage, or intracranial mass. Moderate atrophy. Marked hypodensity in the white matter consistent with chronic small vessel ischemic change. Stable ventricle size. Vascular: No hyperdense vessels. Scattered carotid vascular calcification Skull: Normal. Negative for fracture or focal lesion. Sinuses/Orbits: No acute finding. Other: None CT CERVICAL SPINE FINDINGS Alignment: Straightening of the cervical spine. No subluxation. Facet alignment is maintained. Skull base and vertebrae: No acute fracture. No primary bone lesion or focal pathologic process. Soft tissues and spinal canal: No prevertebral fluid or swelling. No visible canal hematoma. Disc levels: Degenerative changes at multiple levels. There is advanced degenerative change at C5-C6. Multiple level facet degenerative change with foraminal stenosis. Upper chest: Negative. Other: None IMPRESSION: 1. No CT evidence for acute intracranial abnormality. Atrophy and chronic small vessel ischemic change of the white matter. 2. Straightening of the cervical spine with degenerative changes. No acute osseous abnormality. Electronically Signed   By: Donavan Foil M.D.   On: 05/27/2020 23:32    Procedures Procedures (including critical care time)  Medications Ordered in ED Medications - No data to display  ED Course  I have reviewed the triage vital signs and the nursing notes.  Pertinent labs & imaging results that were available during my care of the patient were reviewed by me and considered in my medical decision making (see chart for details).    MDM Rules/Calculators/A&P                         This patient complains of fall at her long-term care facility; this involves an extensive number of treatment Options and is a complaint that carries with it a high risk of complications and Morbidity. The differential includes fall, head bleed, skull fracture, cervical fracture I  ordered imaging studies which included head and cervical spine CT and I independently    visualized and interpreted imaging which showed no acute findings Additional history obtained from EMS Previous records obtained and reviewed in epic, seen for a fall in June and just 11 days ago for dehydration  After the interventions stated above, I reevaluated the patient and found patient to be sleepy but arousable to voice and will interact.  No focal findings on neurologic exam.  To follow-up on testing and reassess for disposition likely return back to her facility  Final Clinical Impression(s) / ED Diagnoses Final diagnoses:  Fall, initial encounter  Minor head injury, initial encounter    Rx / DC Orders  ED Discharge Orders    None       Hayden Rasmussen, MD 05/28/20 1027

## 2020-05-28 DIAGNOSIS — S0990XA Unspecified injury of head, initial encounter: Secondary | ICD-10-CM | POA: Diagnosis not present

## 2020-05-28 LAB — CBC WITH DIFFERENTIAL/PLATELET
Abs Immature Granulocytes: 0.03 10*3/uL (ref 0.00–0.07)
Basophils Absolute: 0 10*3/uL (ref 0.0–0.1)
Basophils Relative: 1 %
Eosinophils Absolute: 0.1 10*3/uL (ref 0.0–0.5)
Eosinophils Relative: 1 %
HCT: 37.8 % (ref 36.0–46.0)
Hemoglobin: 12.2 g/dL (ref 12.0–15.0)
Immature Granulocytes: 1 %
Lymphocytes Relative: 34 %
Lymphs Abs: 2 10*3/uL (ref 0.7–4.0)
MCH: 30 pg (ref 26.0–34.0)
MCHC: 32.3 g/dL (ref 30.0–36.0)
MCV: 93.1 fL (ref 80.0–100.0)
Monocytes Absolute: 0.9 10*3/uL (ref 0.1–1.0)
Monocytes Relative: 16 %
Neutro Abs: 2.9 10*3/uL (ref 1.7–7.7)
Neutrophils Relative %: 47 %
Platelets: 142 10*3/uL — ABNORMAL LOW (ref 150–400)
RBC: 4.06 MIL/uL (ref 3.87–5.11)
RDW: 13.8 % (ref 11.5–15.5)
WBC: 5.9 10*3/uL (ref 4.0–10.5)
nRBC: 0 % (ref 0.0–0.2)

## 2020-05-28 LAB — URINALYSIS, ROUTINE W REFLEX MICROSCOPIC
Bilirubin Urine: NEGATIVE
Glucose, UA: NEGATIVE mg/dL
Hgb urine dipstick: NEGATIVE
Ketones, ur: 5 mg/dL — AB
Leukocytes,Ua: NEGATIVE
Nitrite: NEGATIVE
Protein, ur: NEGATIVE mg/dL
Specific Gravity, Urine: 1.024 (ref 1.005–1.030)
pH: 6 (ref 5.0–8.0)

## 2020-05-28 LAB — BASIC METABOLIC PANEL
Anion gap: 7 (ref 5–15)
BUN: 21 mg/dL (ref 8–23)
CO2: 30 mmol/L (ref 22–32)
Calcium: 8.3 mg/dL — ABNORMAL LOW (ref 8.9–10.3)
Chloride: 104 mmol/L (ref 98–111)
Creatinine, Ser: 0.52 mg/dL (ref 0.44–1.00)
GFR calc Af Amer: >60 mL/min (ref >60)
GFR calc non Af Amer: >60 mL/min (ref >60)
Glucose, Bld: 103 mg/dL — ABNORMAL HIGH (ref 70–99)
Potassium: 3.4 mmol/L — ABNORMAL LOW (ref 3.5–5.1)
Sodium: 141 mmol/L (ref 135–145)

## 2020-05-28 NOTE — ED Provider Notes (Signed)
Patient signed out pending CT scan.  Reportedly fell at her nursing home without any collateral information.  Unknown whether this was mechanical or syncopal.  CT scans reviewed and showed no evidence of intracranial injury or neck fracture.  On my evaluation, patient is extremely somnolent.  She will localize to pain but will not open her eyes to voice or pain.  Called her nursing facility and they state that she has had a general decline with increased somnolence.  Unknown timeframe.  Because of this, I have added some basic lab work and urine.  Will obtain an EKG as well.  2:03 AM Lab work and EKG reviewed.  Largely unremarkable.  No evidence of urinary tract infection.  No evidence of acute arrhythmia or ischemia.  No significant metabolic derangements.  She remains somnolent but moves all 4 extremities and no focal deficit.  Will discharge back to living facility.  Physical Exam  BP 103/61 (BP Location: Left Arm)   Pulse (!) 57   Temp 98.1 F (36.7 C)   Resp 18   SpO2 95%   Physical Exam   Somnolent, minimally arousable Pupils 6 mm reactive bilaterally Moves all 4 extremities, disoriented  ED Course/Procedures     Procedures  MDM   Problem List Items Addressed This Visit    None    Visit Diagnoses    Fall, initial encounter    -  Primary   Minor head injury, initial encounter                Merryl Hacker, MD 05/28/20 [phone]

## 2020-05-31 ENCOUNTER — Encounter (HOSPITAL_COMMUNITY): Payer: Self-pay | Admitting: Emergency Medicine

## 2020-05-31 ENCOUNTER — Emergency Department (HOSPITAL_COMMUNITY)
Admission: EM | Admit: 2020-05-31 | Discharge: 2020-05-31 | Disposition: A | Discharge status: Skilled Nursing Facility | Payer: Medicare HMO | Attending: Emergency Medicine | Admitting: Emergency Medicine

## 2020-05-31 ENCOUNTER — Emergency Department (HOSPITAL_COMMUNITY): Payer: Medicare HMO

## 2020-05-31 ENCOUNTER — Other Ambulatory Visit: Payer: Self-pay

## 2020-05-31 DIAGNOSIS — Y999 Unspecified external cause status: Secondary | ICD-10-CM | POA: Diagnosis not present

## 2020-05-31 DIAGNOSIS — Z79899 Other long term (current) drug therapy: Secondary | ICD-10-CM | POA: Insufficient documentation

## 2020-05-31 DIAGNOSIS — Y939 Activity, unspecified: Secondary | ICD-10-CM | POA: Diagnosis not present

## 2020-05-31 DIAGNOSIS — S161XXA Strain of muscle, fascia and tendon at neck level, initial encounter: Secondary | ICD-10-CM | POA: Diagnosis not present

## 2020-05-31 DIAGNOSIS — Y929 Unspecified place or not applicable: Secondary | ICD-10-CM | POA: Diagnosis not present

## 2020-05-31 DIAGNOSIS — W19XXXA Unspecified fall, initial encounter: Secondary | ICD-10-CM | POA: Insufficient documentation

## 2020-05-31 DIAGNOSIS — S0990XA Unspecified injury of head, initial encounter: Secondary | ICD-10-CM | POA: Diagnosis not present

## 2020-05-31 NOTE — ED Notes (Signed)
Pt given sandwich and water at this time

## 2020-05-31 NOTE — ED Notes (Signed)
Attempted to call report to [facility], but no answer - went to voicemail.

## 2020-05-31 NOTE — ED Notes (Signed)
[facility] called for transport to [facility].

## 2020-05-31 NOTE — Discharge Instructions (Signed)
Continue medications as previously prescribed.  Return to the ER for severe headache, changes in mental status, or other new and concerning symptoms.

## 2020-05-31 NOTE — ED Provider Notes (Signed)
[facility] DEPT Provider Note   CSN: [id number] Arrival date & time: 05/31/20  1647     History No chief complaint on file.   Tracy Jenkins is a 79 y.o. female.  Patient is a 79 year old female with history of vascular dementia.  She is brought from her extended care facility for evaluation of a fall.  Patient has no recollection of what happened and denies to me that she is having any symptoms, however history is limited secondary to dementia.  From what I am told, she was witnessed tripping over the wheel of a wheelchair and falling on the floor by one of the other residents.  The history is provided by the patient.       Past Medical History:  Diagnosis Date  . 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   History reviewed. No pertinent surgical history.   OB History   No obstetric history on file.     No family history on file.  Social History   Tobacco Use  . Smoking status: Not on file  Substance Use Topics  . Alcohol use: Not on file  . Drug use: Not on file    Home Medications Prior to Admission medications   Medication Sig Start Date End Date Taking? Authorizing Provider  acetaminophen (TYLENOL) 500 MG tablet Take 500 mg by mouth every 6 (six) hours as needed for mild pain, fever or headache.    [provider]  alum & mag hydroxide-simeth (MI-ACID) 200-200-20 MG/5ML suspension Take 30 mLs by mouth every 6 (six) hours as needed for indigestion or heartburn.    [provider]  citalopram (CELEXA) 10 MG tablet Take 10 mg by mouth daily.    [provider]  divalproex (DEPAKOTE SPRINKLE) 125 MG capsule Take 250 mg by mouth 3 (three) times daily.    [provider]  guaifenesin (ROBITUSSIN) 100 MG/5ML syrup Take 200 mg by mouth every 6 (six) hours as needed for cough.    [provider]  loperamide (IMODIUM) 2 MG capsule Take 2 mg by mouth as needed  for diarrhea or loose stools. Not to exceed 8 doses in 24 hours    [provider]  magnesium hydroxide (MILK OF MAGNESIA) 400 MG/5ML suspension Take 30 mLs by mouth at bedtime as needed for mild constipation.    [provider]  neomycin-bacitracin-polymyxin (NEOSPORIN) ointment Apply 1 application topically as needed for wound care.    [provider]  QUEtiapine (SEROQUEL) 25 MG tablet Take 25 mg by mouth 2 (two) times daily.    [provider]  traZODone (DESYREL) 50 MG tablet Take 50 mg by mouth at bedtime.    [provider]    Allergies    Patient has no known allergies.  Review of Systems   Review of Systems  Unable to perform ROS: Dementia    Physical Exam Updated Vital Signs There were no vitals taken for this visit.  Physical Exam Vitals and nursing note reviewed.  Constitutional:      General: She is not in acute distress.    Appearance: She is well-developed. She is not diaphoretic.  HENT:     Head: Normocephalic and atraumatic.  Cardiovascular:     Rate and Rhythm: Normal rate and regular rhythm.     Heart sounds: No murmur heard.  No friction rub. No gallop.   Pulmonary:     Effort: Pulmonary effort  is normal. No respiratory distress.     Breath sounds: Normal breath sounds. No wheezing.  Abdominal:     General: Bowel sounds are normal. There is no distension.     Palpations: Abdomen is soft.     Tenderness: There is no abdominal tenderness.  Musculoskeletal:        General: Normal range of motion.     Cervical back: Normal range of motion and neck supple.  Skin:    General: Skin is warm and dry.  Neurological:     General: No focal deficit present.     Mental Status: She is alert.     Comments: Patient is awake, alert, and pleasant.  She appears in no distress.  She moves all extremities and other than confusion are no focal deficits.     ED Results / Procedures / Treatments   Labs (all labs ordered are  listed, but only abnormal results are displayed) Labs Reviewed - No data to display  EKG None  Radiology No results found.  Procedures Procedures (including critical care time)  Medications Ordered in ED Medications - No data to display  ED Course  I have reviewed the triage vital signs and the nursing notes.  Pertinent labs & imaging results that were available during my care of the patient were reviewed by me and considered in my medical decision making (see chart for details).    MDM Rules/Calculators/A&P  Patient brought here by EMS after a fall at her extended care facility.  Patient with history of vascular dementia and adds no useful history.  Her physical examination is unremarkable and she appears well.  She did undergo CT of the head and cervical spine, both of which were unremarkable.  She has full range of motion of all extremities and vitals are stable.  I do not feel as though any further imaging is indicated.  Patient to be returned to her extended care facility.  She is to return as needed for any problems.  Final Clinical Impression(s) / ED Diagnoses Final diagnoses:  None    Rx / DC Orders ED Discharge Orders    None       Veryl Speak, MD 05/31/20 2778

## 2020-05-31 NOTE — ED Triage Notes (Signed)
Pt BIB EMS from [facility] c/o fall. Facility states the fall was unwitnessed, but a fellow resident reported the pt tripped over the wheel of a wheelchair. Denies pain. Facility wanted the patient checked out. No obvious injuries. Hx of dementia and sinus brady.

## 2020-09-28 DIAGNOSIS — R69 Illness, unspecified: Secondary | ICD-10-CM | POA: Diagnosis not present

## 2020-09-29 DIAGNOSIS — R69 Illness, unspecified: Secondary | ICD-10-CM | POA: Diagnosis not present

## 2020-09-30 DIAGNOSIS — R69 Illness, unspecified: Secondary | ICD-10-CM | POA: Diagnosis not present

## 2020-10-01 DIAGNOSIS — R4189 Other symptoms and signs involving cognitive functions and awareness: Secondary | ICD-10-CM | POA: Diagnosis not present

## 2020-10-01 DIAGNOSIS — R69 Illness, unspecified: Secondary | ICD-10-CM | POA: Diagnosis not present

## 2020-10-02 DIAGNOSIS — R69 Illness, unspecified: Secondary | ICD-10-CM | POA: Diagnosis not present

## 2020-10-03 DIAGNOSIS — R69 Illness, unspecified: Secondary | ICD-10-CM | POA: Diagnosis not present

## 2020-10-04 DIAGNOSIS — R69 Illness, unspecified: Secondary | ICD-10-CM | POA: Diagnosis not present

## 2020-10-05 DIAGNOSIS — R69 Illness, unspecified: Secondary | ICD-10-CM | POA: Diagnosis not present

## 2020-10-06 DIAGNOSIS — R69 Illness, unspecified: Secondary | ICD-10-CM | POA: Diagnosis not present

## 2020-10-07 DIAGNOSIS — R634 Abnormal weight loss: Secondary | ICD-10-CM | POA: Diagnosis not present

## 2020-10-07 DIAGNOSIS — Z741 Need for assistance with personal care: Secondary | ICD-10-CM | POA: Diagnosis not present

## 2020-10-07 DIAGNOSIS — R1311 Dysphagia, oral phase: Secondary | ICD-10-CM | POA: Diagnosis not present

## 2020-10-07 DIAGNOSIS — R69 Illness, unspecified: Secondary | ICD-10-CM | POA: Diagnosis not present

## 2020-10-08 DIAGNOSIS — R69 Illness, unspecified: Secondary | ICD-10-CM | POA: Diagnosis not present

## 2020-10-09 DIAGNOSIS — Z20828 Contact with and (suspected) exposure to other viral communicable diseases: Secondary | ICD-10-CM | POA: Diagnosis not present

## 2020-10-09 DIAGNOSIS — R69 Illness, unspecified: Secondary | ICD-10-CM | POA: Diagnosis not present

## 2020-10-10 DIAGNOSIS — R69 Illness, unspecified: Secondary | ICD-10-CM | POA: Diagnosis not present

## 2020-10-11 DIAGNOSIS — R69 Illness, unspecified: Secondary | ICD-10-CM | POA: Diagnosis not present

## 2020-10-12 DIAGNOSIS — R69 Illness, unspecified: Secondary | ICD-10-CM | POA: Diagnosis not present

## 2020-10-13 DIAGNOSIS — R69 Illness, unspecified: Secondary | ICD-10-CM | POA: Diagnosis not present

## 2020-10-14 DIAGNOSIS — R69 Illness, unspecified: Secondary | ICD-10-CM | POA: Diagnosis not present

## 2020-10-15 DIAGNOSIS — R69 Illness, unspecified: Secondary | ICD-10-CM | POA: Diagnosis not present

## 2020-10-16 DIAGNOSIS — R69 Illness, unspecified: Secondary | ICD-10-CM | POA: Diagnosis not present

## 2020-10-17 DIAGNOSIS — R69 Illness, unspecified: Secondary | ICD-10-CM | POA: Diagnosis not present

## 2020-10-17 DIAGNOSIS — U071 COVID-19: Secondary | ICD-10-CM | POA: Diagnosis not present

## 2020-10-18 DIAGNOSIS — R69 Illness, unspecified: Secondary | ICD-10-CM | POA: Diagnosis not present

## 2020-10-19 DIAGNOSIS — R69 Illness, unspecified: Secondary | ICD-10-CM | POA: Diagnosis not present

## 2020-10-20 DIAGNOSIS — R69 Illness, unspecified: Secondary | ICD-10-CM | POA: Diagnosis not present

## 2020-10-21 DIAGNOSIS — R69 Illness, unspecified: Secondary | ICD-10-CM | POA: Diagnosis not present

## 2020-10-22 DIAGNOSIS — R69 Illness, unspecified: Secondary | ICD-10-CM | POA: Diagnosis not present

## 2020-10-23 DIAGNOSIS — R69 Illness, unspecified: Secondary | ICD-10-CM | POA: Diagnosis not present

## 2020-10-24 DIAGNOSIS — R69 Illness, unspecified: Secondary | ICD-10-CM | POA: Diagnosis not present

## 2020-10-25 DIAGNOSIS — R69 Illness, unspecified: Secondary | ICD-10-CM | POA: Diagnosis not present

## 2020-10-26 DIAGNOSIS — R69 Illness, unspecified: Secondary | ICD-10-CM | POA: Diagnosis not present

## 2020-10-27 DIAGNOSIS — R69 Illness, unspecified: Secondary | ICD-10-CM | POA: Diagnosis not present

## 2020-10-28 DIAGNOSIS — R69 Illness, unspecified: Secondary | ICD-10-CM | POA: Diagnosis not present

## 2020-10-29 DIAGNOSIS — R69 Illness, unspecified: Secondary | ICD-10-CM | POA: Diagnosis not present

## 2020-10-29 DIAGNOSIS — R4189 Other symptoms and signs involving cognitive functions and awareness: Secondary | ICD-10-CM | POA: Diagnosis not present

## 2020-10-30 DIAGNOSIS — R69 Illness, unspecified: Secondary | ICD-10-CM | POA: Diagnosis not present

## 2020-10-31 DIAGNOSIS — R69 Illness, unspecified: Secondary | ICD-10-CM | POA: Diagnosis not present

## 2020-11-01 DIAGNOSIS — R69 Illness, unspecified: Secondary | ICD-10-CM | POA: Diagnosis not present

## 2020-11-02 DIAGNOSIS — R69 Illness, unspecified: Secondary | ICD-10-CM | POA: Diagnosis not present

## 2020-11-03 DIAGNOSIS — R69 Illness, unspecified: Secondary | ICD-10-CM | POA: Diagnosis not present

## 2020-11-04 DIAGNOSIS — R69 Illness, unspecified: Secondary | ICD-10-CM | POA: Diagnosis not present

## 2020-11-05 DIAGNOSIS — R69 Illness, unspecified: Secondary | ICD-10-CM | POA: Diagnosis not present

## 2020-11-06 DIAGNOSIS — R69 Illness, unspecified: Secondary | ICD-10-CM | POA: Diagnosis not present

## 2020-11-07 DIAGNOSIS — R69 Illness, unspecified: Secondary | ICD-10-CM | POA: Diagnosis not present

## 2020-11-08 DIAGNOSIS — R69 Illness, unspecified: Secondary | ICD-10-CM | POA: Diagnosis not present

## 2020-11-09 DIAGNOSIS — R69 Illness, unspecified: Secondary | ICD-10-CM | POA: Diagnosis not present

## 2020-11-10 DIAGNOSIS — R69 Illness, unspecified: Secondary | ICD-10-CM | POA: Diagnosis not present

## 2020-11-11 DIAGNOSIS — R69 Illness, unspecified: Secondary | ICD-10-CM | POA: Diagnosis not present

## 2020-11-12 DIAGNOSIS — R69 Illness, unspecified: Secondary | ICD-10-CM | POA: Diagnosis not present

## 2020-11-13 DIAGNOSIS — R69 Illness, unspecified: Secondary | ICD-10-CM | POA: Diagnosis not present

## 2020-11-14 DIAGNOSIS — R69 Illness, unspecified: Secondary | ICD-10-CM | POA: Diagnosis not present

## 2020-11-15 DIAGNOSIS — R69 Illness, unspecified: Secondary | ICD-10-CM | POA: Diagnosis not present

## 2020-11-16 DIAGNOSIS — R69 Illness, unspecified: Secondary | ICD-10-CM | POA: Diagnosis not present

## 2020-11-17 DIAGNOSIS — R69 Illness, unspecified: Secondary | ICD-10-CM | POA: Diagnosis not present

## 2020-11-18 DIAGNOSIS — R69 Illness, unspecified: Secondary | ICD-10-CM | POA: Diagnosis not present

## 2020-11-19 DIAGNOSIS — R4189 Other symptoms and signs involving cognitive functions and awareness: Secondary | ICD-10-CM | POA: Diagnosis not present

## 2020-11-19 DIAGNOSIS — R69 Illness, unspecified: Secondary | ICD-10-CM | POA: Diagnosis not present

## 2020-11-20 DIAGNOSIS — R69 Illness, unspecified: Secondary | ICD-10-CM | POA: Diagnosis not present

## 2020-11-21 DIAGNOSIS — R69 Illness, unspecified: Secondary | ICD-10-CM | POA: Diagnosis not present

## 2020-11-22 DIAGNOSIS — R69 Illness, unspecified: Secondary | ICD-10-CM | POA: Diagnosis not present

## 2020-11-23 DIAGNOSIS — R69 Illness, unspecified: Secondary | ICD-10-CM | POA: Diagnosis not present

## 2020-11-24 DIAGNOSIS — R69 Illness, unspecified: Secondary | ICD-10-CM | POA: Diagnosis not present

## 2020-11-25 DIAGNOSIS — R69 Illness, unspecified: Secondary | ICD-10-CM | POA: Diagnosis not present

## 2020-11-26 DIAGNOSIS — R69 Illness, unspecified: Secondary | ICD-10-CM | POA: Diagnosis not present

## 2020-11-27 DIAGNOSIS — R69 Illness, unspecified: Secondary | ICD-10-CM | POA: Diagnosis not present

## 2020-11-28 DIAGNOSIS — R69 Illness, unspecified: Secondary | ICD-10-CM | POA: Diagnosis not present

## 2020-11-29 DIAGNOSIS — R69 Illness, unspecified: Secondary | ICD-10-CM | POA: Diagnosis not present

## 2020-11-30 DIAGNOSIS — R69 Illness, unspecified: Secondary | ICD-10-CM | POA: Diagnosis not present

## 2020-12-01 DIAGNOSIS — R69 Illness, unspecified: Secondary | ICD-10-CM | POA: Diagnosis not present

## 2020-12-02 DIAGNOSIS — R69 Illness, unspecified: Secondary | ICD-10-CM | POA: Diagnosis not present

## 2020-12-03 DIAGNOSIS — R69 Illness, unspecified: Secondary | ICD-10-CM | POA: Diagnosis not present

## 2020-12-04 DIAGNOSIS — R69 Illness, unspecified: Secondary | ICD-10-CM | POA: Diagnosis not present

## 2020-12-05 DIAGNOSIS — R69 Illness, unspecified: Secondary | ICD-10-CM | POA: Diagnosis not present

## 2020-12-06 DIAGNOSIS — R69 Illness, unspecified: Secondary | ICD-10-CM | POA: Diagnosis not present

## 2020-12-07 DIAGNOSIS — R69 Illness, unspecified: Secondary | ICD-10-CM | POA: Diagnosis not present

## 2020-12-08 DIAGNOSIS — R69 Illness, unspecified: Secondary | ICD-10-CM | POA: Diagnosis not present

## 2020-12-09 DIAGNOSIS — R69 Illness, unspecified: Secondary | ICD-10-CM | POA: Diagnosis not present

## 2020-12-10 DIAGNOSIS — R69 Illness, unspecified: Secondary | ICD-10-CM | POA: Diagnosis not present

## 2020-12-11 DIAGNOSIS — R69 Illness, unspecified: Secondary | ICD-10-CM | POA: Diagnosis not present

## 2020-12-12 DIAGNOSIS — R69 Illness, unspecified: Secondary | ICD-10-CM | POA: Diagnosis not present

## 2020-12-13 DIAGNOSIS — R69 Illness, unspecified: Secondary | ICD-10-CM | POA: Diagnosis not present

## 2020-12-14 DIAGNOSIS — R69 Illness, unspecified: Secondary | ICD-10-CM | POA: Diagnosis not present

## 2020-12-15 DIAGNOSIS — R69 Illness, unspecified: Secondary | ICD-10-CM | POA: Diagnosis not present

## 2020-12-16 DIAGNOSIS — R69 Illness, unspecified: Secondary | ICD-10-CM | POA: Diagnosis not present

## 2020-12-17 DIAGNOSIS — R69 Illness, unspecified: Secondary | ICD-10-CM | POA: Diagnosis not present

## 2020-12-17 DIAGNOSIS — R4189 Other symptoms and signs involving cognitive functions and awareness: Secondary | ICD-10-CM | POA: Diagnosis not present

## 2020-12-18 DIAGNOSIS — R69 Illness, unspecified: Secondary | ICD-10-CM | POA: Diagnosis not present

## 2020-12-19 DIAGNOSIS — R69 Illness, unspecified: Secondary | ICD-10-CM | POA: Diagnosis not present

## 2020-12-20 DIAGNOSIS — R69 Illness, unspecified: Secondary | ICD-10-CM | POA: Diagnosis not present

## 2020-12-21 DIAGNOSIS — R69 Illness, unspecified: Secondary | ICD-10-CM | POA: Diagnosis not present

## 2020-12-22 DIAGNOSIS — R69 Illness, unspecified: Secondary | ICD-10-CM | POA: Diagnosis not present

## 2020-12-23 DIAGNOSIS — R69 Illness, unspecified: Secondary | ICD-10-CM | POA: Diagnosis not present

## 2020-12-24 DIAGNOSIS — R69 Illness, unspecified: Secondary | ICD-10-CM | POA: Diagnosis not present

## 2020-12-25 DIAGNOSIS — R69 Illness, unspecified: Secondary | ICD-10-CM | POA: Diagnosis not present

## 2020-12-26 DIAGNOSIS — R69 Illness, unspecified: Secondary | ICD-10-CM | POA: Diagnosis not present

## 2020-12-27 DIAGNOSIS — R69 Illness, unspecified: Secondary | ICD-10-CM | POA: Diagnosis not present

## 2020-12-28 DIAGNOSIS — R69 Illness, unspecified: Secondary | ICD-10-CM | POA: Diagnosis not present

## 2020-12-29 DIAGNOSIS — R69 Illness, unspecified: Secondary | ICD-10-CM | POA: Diagnosis not present

## 2020-12-30 DIAGNOSIS — R69 Illness, unspecified: Secondary | ICD-10-CM | POA: Diagnosis not present

## 2020-12-31 DIAGNOSIS — R69 Illness, unspecified: Secondary | ICD-10-CM | POA: Diagnosis not present

## 2021-01-01 DIAGNOSIS — R69 Illness, unspecified: Secondary | ICD-10-CM | POA: Diagnosis not present

## 2021-01-02 DIAGNOSIS — R69 Illness, unspecified: Secondary | ICD-10-CM | POA: Diagnosis not present

## 2021-01-03 DIAGNOSIS — R69 Illness, unspecified: Secondary | ICD-10-CM | POA: Diagnosis not present

## 2021-01-04 DIAGNOSIS — R69 Illness, unspecified: Secondary | ICD-10-CM | POA: Diagnosis not present

## 2021-01-05 DIAGNOSIS — R69 Illness, unspecified: Secondary | ICD-10-CM | POA: Diagnosis not present

## 2021-01-06 DIAGNOSIS — R69 Illness, unspecified: Secondary | ICD-10-CM | POA: Diagnosis not present

## 2021-01-07 DIAGNOSIS — R69 Illness, unspecified: Secondary | ICD-10-CM | POA: Diagnosis not present

## 2021-01-08 DIAGNOSIS — R69 Illness, unspecified: Secondary | ICD-10-CM | POA: Diagnosis not present

## 2021-01-09 DIAGNOSIS — R69 Illness, unspecified: Secondary | ICD-10-CM | POA: Diagnosis not present

## 2021-01-10 DIAGNOSIS — R69 Illness, unspecified: Secondary | ICD-10-CM | POA: Diagnosis not present

## 2021-01-11 DIAGNOSIS — R69 Illness, unspecified: Secondary | ICD-10-CM | POA: Diagnosis not present

## 2021-01-12 DIAGNOSIS — R69 Illness, unspecified: Secondary | ICD-10-CM | POA: Diagnosis not present

## 2021-01-13 DIAGNOSIS — R69 Illness, unspecified: Secondary | ICD-10-CM | POA: Diagnosis not present

## 2021-01-14 DIAGNOSIS — R4189 Other symptoms and signs involving cognitive functions and awareness: Secondary | ICD-10-CM | POA: Diagnosis not present

## 2021-01-14 DIAGNOSIS — R69 Illness, unspecified: Secondary | ICD-10-CM | POA: Diagnosis not present

## 2021-01-15 DIAGNOSIS — R69 Illness, unspecified: Secondary | ICD-10-CM | POA: Diagnosis not present

## 2021-01-16 DIAGNOSIS — R69 Illness, unspecified: Secondary | ICD-10-CM | POA: Diagnosis not present

## 2021-01-17 DIAGNOSIS — R69 Illness, unspecified: Secondary | ICD-10-CM | POA: Diagnosis not present

## 2021-01-18 DIAGNOSIS — R69 Illness, unspecified: Secondary | ICD-10-CM | POA: Diagnosis not present

## 2021-01-19 DIAGNOSIS — R69 Illness, unspecified: Secondary | ICD-10-CM | POA: Diagnosis not present

## 2021-01-20 DIAGNOSIS — R69 Illness, unspecified: Secondary | ICD-10-CM | POA: Diagnosis not present

## 2021-01-21 DIAGNOSIS — R69 Illness, unspecified: Secondary | ICD-10-CM | POA: Diagnosis not present

## 2021-01-22 DIAGNOSIS — R69 Illness, unspecified: Secondary | ICD-10-CM | POA: Diagnosis not present

## 2021-01-23 DIAGNOSIS — R69 Illness, unspecified: Secondary | ICD-10-CM | POA: Diagnosis not present

## 2021-01-24 DIAGNOSIS — R69 Illness, unspecified: Secondary | ICD-10-CM | POA: Diagnosis not present

## 2021-01-25 DIAGNOSIS — R69 Illness, unspecified: Secondary | ICD-10-CM | POA: Diagnosis not present

## 2021-01-26 DIAGNOSIS — R69 Illness, unspecified: Secondary | ICD-10-CM | POA: Diagnosis not present

## 2021-01-27 DIAGNOSIS — R69 Illness, unspecified: Secondary | ICD-10-CM | POA: Diagnosis not present

## 2021-01-28 DIAGNOSIS — R69 Illness, unspecified: Secondary | ICD-10-CM | POA: Diagnosis not present

## 2021-01-29 DIAGNOSIS — R69 Illness, unspecified: Secondary | ICD-10-CM | POA: Diagnosis not present

## 2021-01-30 DIAGNOSIS — R69 Illness, unspecified: Secondary | ICD-10-CM | POA: Diagnosis not present

## 2021-01-31 DIAGNOSIS — R69 Illness, unspecified: Secondary | ICD-10-CM | POA: Diagnosis not present

## 2021-02-01 DIAGNOSIS — R69 Illness, unspecified: Secondary | ICD-10-CM | POA: Diagnosis not present

## 2021-02-02 DIAGNOSIS — R69 Illness, unspecified: Secondary | ICD-10-CM | POA: Diagnosis not present

## 2021-02-03 DIAGNOSIS — R69 Illness, unspecified: Secondary | ICD-10-CM | POA: Diagnosis not present

## 2021-02-04 DIAGNOSIS — R69 Illness, unspecified: Secondary | ICD-10-CM | POA: Diagnosis not present

## 2021-02-05 DIAGNOSIS — R69 Illness, unspecified: Secondary | ICD-10-CM | POA: Diagnosis not present

## 2021-02-06 DIAGNOSIS — R69 Illness, unspecified: Secondary | ICD-10-CM | POA: Diagnosis not present

## 2021-02-07 DIAGNOSIS — R69 Illness, unspecified: Secondary | ICD-10-CM | POA: Diagnosis not present

## 2021-02-08 DIAGNOSIS — R69 Illness, unspecified: Secondary | ICD-10-CM | POA: Diagnosis not present

## 2021-02-09 DIAGNOSIS — R69 Illness, unspecified: Secondary | ICD-10-CM | POA: Diagnosis not present

## 2021-02-10 DIAGNOSIS — G301 Alzheimer's disease with late onset: Secondary | ICD-10-CM | POA: Diagnosis not present

## 2021-02-10 DIAGNOSIS — R69 Illness, unspecified: Secondary | ICD-10-CM | POA: Diagnosis not present

## 2021-02-10 DIAGNOSIS — Z741 Need for assistance with personal care: Secondary | ICD-10-CM | POA: Diagnosis not present

## 2021-02-11 DIAGNOSIS — R4189 Other symptoms and signs involving cognitive functions and awareness: Secondary | ICD-10-CM | POA: Diagnosis not present

## 2021-02-11 DIAGNOSIS — R69 Illness, unspecified: Secondary | ICD-10-CM | POA: Diagnosis not present

## 2021-02-12 DIAGNOSIS — R69 Illness, unspecified: Secondary | ICD-10-CM | POA: Diagnosis not present

## 2021-02-13 DIAGNOSIS — R69 Illness, unspecified: Secondary | ICD-10-CM | POA: Diagnosis not present

## 2021-02-14 DIAGNOSIS — R69 Illness, unspecified: Secondary | ICD-10-CM | POA: Diagnosis not present

## 2021-02-15 DIAGNOSIS — R69 Illness, unspecified: Secondary | ICD-10-CM | POA: Diagnosis not present

## 2021-02-16 DIAGNOSIS — R69 Illness, unspecified: Secondary | ICD-10-CM | POA: Diagnosis not present

## 2021-02-17 DIAGNOSIS — R69 Illness, unspecified: Secondary | ICD-10-CM | POA: Diagnosis not present

## 2021-02-18 DIAGNOSIS — R69 Illness, unspecified: Secondary | ICD-10-CM | POA: Diagnosis not present

## 2021-02-19 DIAGNOSIS — R69 Illness, unspecified: Secondary | ICD-10-CM | POA: Diagnosis not present

## 2021-02-20 DIAGNOSIS — R69 Illness, unspecified: Secondary | ICD-10-CM | POA: Diagnosis not present

## 2021-02-21 DIAGNOSIS — Z1152 Encounter for screening for COVID-19: Secondary | ICD-10-CM | POA: Diagnosis not present

## 2021-02-21 DIAGNOSIS — R69 Illness, unspecified: Secondary | ICD-10-CM | POA: Diagnosis not present

## 2021-02-22 DIAGNOSIS — R69 Illness, unspecified: Secondary | ICD-10-CM | POA: Diagnosis not present

## 2021-02-23 DIAGNOSIS — R69 Illness, unspecified: Secondary | ICD-10-CM | POA: Diagnosis not present

## 2021-02-24 DIAGNOSIS — R69 Illness, unspecified: Secondary | ICD-10-CM | POA: Diagnosis not present

## 2021-02-25 DIAGNOSIS — R69 Illness, unspecified: Secondary | ICD-10-CM | POA: Diagnosis not present

## 2021-02-26 DIAGNOSIS — G301 Alzheimer's disease with late onset: Secondary | ICD-10-CM | POA: Diagnosis not present

## 2021-02-26 DIAGNOSIS — R69 Illness, unspecified: Secondary | ICD-10-CM | POA: Diagnosis not present

## 2021-02-26 DIAGNOSIS — Z79899 Other long term (current) drug therapy: Secondary | ICD-10-CM | POA: Diagnosis not present

## 2021-02-27 DIAGNOSIS — Z1152 Encounter for screening for COVID-19: Secondary | ICD-10-CM | POA: Diagnosis not present

## 2021-02-27 DIAGNOSIS — R69 Illness, unspecified: Secondary | ICD-10-CM | POA: Diagnosis not present

## 2021-02-28 DIAGNOSIS — R69 Illness, unspecified: Secondary | ICD-10-CM | POA: Diagnosis not present

## 2021-03-01 DIAGNOSIS — R69 Illness, unspecified: Secondary | ICD-10-CM | POA: Diagnosis not present

## 2021-03-02 DIAGNOSIS — R69 Illness, unspecified: Secondary | ICD-10-CM | POA: Diagnosis not present

## 2021-03-03 DIAGNOSIS — R69 Illness, unspecified: Secondary | ICD-10-CM | POA: Diagnosis not present

## 2021-03-03 DIAGNOSIS — U071 COVID-19: Secondary | ICD-10-CM | POA: Diagnosis not present

## 2021-03-03 DIAGNOSIS — Z741 Need for assistance with personal care: Secondary | ICD-10-CM | POA: Diagnosis not present

## 2021-03-04 DIAGNOSIS — R69 Illness, unspecified: Secondary | ICD-10-CM | POA: Diagnosis not present

## 2021-03-05 DIAGNOSIS — R69 Illness, unspecified: Secondary | ICD-10-CM | POA: Diagnosis not present

## 2021-03-06 DIAGNOSIS — R69 Illness, unspecified: Secondary | ICD-10-CM | POA: Diagnosis not present

## 2021-03-07 DIAGNOSIS — R69 Illness, unspecified: Secondary | ICD-10-CM | POA: Diagnosis not present

## 2021-03-08 DIAGNOSIS — R69 Illness, unspecified: Secondary | ICD-10-CM | POA: Diagnosis not present

## 2021-03-09 DIAGNOSIS — R69 Illness, unspecified: Secondary | ICD-10-CM | POA: Diagnosis not present

## 2021-03-10 DIAGNOSIS — R69 Illness, unspecified: Secondary | ICD-10-CM | POA: Diagnosis not present

## 2021-03-11 DIAGNOSIS — R4189 Other symptoms and signs involving cognitive functions and awareness: Secondary | ICD-10-CM | POA: Diagnosis not present

## 2021-03-11 DIAGNOSIS — R69 Illness, unspecified: Secondary | ICD-10-CM | POA: Diagnosis not present

## 2021-03-12 DIAGNOSIS — R69 Illness, unspecified: Secondary | ICD-10-CM | POA: Diagnosis not present

## 2021-03-13 DIAGNOSIS — R69 Illness, unspecified: Secondary | ICD-10-CM | POA: Diagnosis not present

## 2021-03-14 DIAGNOSIS — R69 Illness, unspecified: Secondary | ICD-10-CM | POA: Diagnosis not present

## 2021-03-15 DIAGNOSIS — R69 Illness, unspecified: Secondary | ICD-10-CM | POA: Diagnosis not present

## 2021-03-16 DIAGNOSIS — R69 Illness, unspecified: Secondary | ICD-10-CM | POA: Diagnosis not present

## 2021-03-17 DIAGNOSIS — R69 Illness, unspecified: Secondary | ICD-10-CM | POA: Diagnosis not present

## 2021-03-18 DIAGNOSIS — R69 Illness, unspecified: Secondary | ICD-10-CM | POA: Diagnosis not present

## 2021-03-19 DIAGNOSIS — R69 Illness, unspecified: Secondary | ICD-10-CM | POA: Diagnosis not present

## 2021-03-20 DIAGNOSIS — R69 Illness, unspecified: Secondary | ICD-10-CM | POA: Diagnosis not present

## 2021-03-21 DIAGNOSIS — R69 Illness, unspecified: Secondary | ICD-10-CM | POA: Diagnosis not present

## 2021-03-22 DIAGNOSIS — R69 Illness, unspecified: Secondary | ICD-10-CM | POA: Diagnosis not present

## 2021-03-23 DIAGNOSIS — R69 Illness, unspecified: Secondary | ICD-10-CM | POA: Diagnosis not present

## 2021-03-24 DIAGNOSIS — R69 Illness, unspecified: Secondary | ICD-10-CM | POA: Diagnosis not present

## 2021-03-25 DIAGNOSIS — R69 Illness, unspecified: Secondary | ICD-10-CM | POA: Diagnosis not present

## 2021-03-26 DIAGNOSIS — R69 Illness, unspecified: Secondary | ICD-10-CM | POA: Diagnosis not present

## 2021-03-27 DIAGNOSIS — R69 Illness, unspecified: Secondary | ICD-10-CM | POA: Diagnosis not present

## 2021-03-28 DIAGNOSIS — R69 Illness, unspecified: Secondary | ICD-10-CM | POA: Diagnosis not present

## 2021-03-29 DIAGNOSIS — R69 Illness, unspecified: Secondary | ICD-10-CM | POA: Diagnosis not present

## 2021-03-30 DIAGNOSIS — R69 Illness, unspecified: Secondary | ICD-10-CM | POA: Diagnosis not present

## 2021-03-31 DIAGNOSIS — R69 Illness, unspecified: Secondary | ICD-10-CM | POA: Diagnosis not present

## 2021-04-01 DIAGNOSIS — R69 Illness, unspecified: Secondary | ICD-10-CM | POA: Diagnosis not present

## 2021-04-02 DIAGNOSIS — R69 Illness, unspecified: Secondary | ICD-10-CM | POA: Diagnosis not present

## 2021-04-03 DIAGNOSIS — R69 Illness, unspecified: Secondary | ICD-10-CM | POA: Diagnosis not present

## 2021-04-04 DIAGNOSIS — R69 Illness, unspecified: Secondary | ICD-10-CM | POA: Diagnosis not present

## 2021-04-05 DIAGNOSIS — R69 Illness, unspecified: Secondary | ICD-10-CM | POA: Diagnosis not present

## 2021-04-06 DIAGNOSIS — R69 Illness, unspecified: Secondary | ICD-10-CM | POA: Diagnosis not present

## 2021-04-07 DIAGNOSIS — R69 Illness, unspecified: Secondary | ICD-10-CM | POA: Diagnosis not present

## 2021-04-08 DIAGNOSIS — R69 Illness, unspecified: Secondary | ICD-10-CM | POA: Diagnosis not present

## 2021-04-08 DIAGNOSIS — R4189 Other symptoms and signs involving cognitive functions and awareness: Secondary | ICD-10-CM | POA: Diagnosis not present

## 2021-04-09 DIAGNOSIS — R69 Illness, unspecified: Secondary | ICD-10-CM | POA: Diagnosis not present

## 2021-04-10 DIAGNOSIS — R69 Illness, unspecified: Secondary | ICD-10-CM | POA: Diagnosis not present

## 2021-04-11 DIAGNOSIS — R69 Illness, unspecified: Secondary | ICD-10-CM | POA: Diagnosis not present

## 2021-04-12 DIAGNOSIS — R69 Illness, unspecified: Secondary | ICD-10-CM | POA: Diagnosis not present

## 2021-04-13 DIAGNOSIS — R69 Illness, unspecified: Secondary | ICD-10-CM | POA: Diagnosis not present

## 2021-04-14 DIAGNOSIS — R69 Illness, unspecified: Secondary | ICD-10-CM | POA: Diagnosis not present

## 2021-04-15 DIAGNOSIS — R69 Illness, unspecified: Secondary | ICD-10-CM | POA: Diagnosis not present

## 2021-04-16 DIAGNOSIS — R69 Illness, unspecified: Secondary | ICD-10-CM | POA: Diagnosis not present

## 2021-04-17 DIAGNOSIS — R69 Illness, unspecified: Secondary | ICD-10-CM | POA: Diagnosis not present

## 2021-04-18 DIAGNOSIS — R69 Illness, unspecified: Secondary | ICD-10-CM | POA: Diagnosis not present

## 2021-04-19 DIAGNOSIS — R69 Illness, unspecified: Secondary | ICD-10-CM | POA: Diagnosis not present

## 2021-04-20 DIAGNOSIS — R69 Illness, unspecified: Secondary | ICD-10-CM | POA: Diagnosis not present

## 2021-04-21 DIAGNOSIS — R69 Illness, unspecified: Secondary | ICD-10-CM | POA: Diagnosis not present

## 2021-04-22 DIAGNOSIS — R69 Illness, unspecified: Secondary | ICD-10-CM | POA: Diagnosis not present

## 2021-04-23 DIAGNOSIS — R69 Illness, unspecified: Secondary | ICD-10-CM | POA: Diagnosis not present

## 2021-04-24 DIAGNOSIS — R69 Illness, unspecified: Secondary | ICD-10-CM | POA: Diagnosis not present

## 2021-04-25 DIAGNOSIS — R69 Illness, unspecified: Secondary | ICD-10-CM | POA: Diagnosis not present

## 2021-04-26 DIAGNOSIS — R69 Illness, unspecified: Secondary | ICD-10-CM | POA: Diagnosis not present

## 2021-04-27 DIAGNOSIS — R69 Illness, unspecified: Secondary | ICD-10-CM | POA: Diagnosis not present

## 2021-04-28 DIAGNOSIS — R69 Illness, unspecified: Secondary | ICD-10-CM | POA: Diagnosis not present

## 2021-04-29 DIAGNOSIS — R69 Illness, unspecified: Secondary | ICD-10-CM | POA: Diagnosis not present

## 2021-04-30 DIAGNOSIS — R69 Illness, unspecified: Secondary | ICD-10-CM | POA: Diagnosis not present

## 2021-05-01 DIAGNOSIS — R69 Illness, unspecified: Secondary | ICD-10-CM | POA: Diagnosis not present

## 2021-05-02 DIAGNOSIS — R69 Illness, unspecified: Secondary | ICD-10-CM | POA: Diagnosis not present

## 2021-05-03 DIAGNOSIS — R69 Illness, unspecified: Secondary | ICD-10-CM | POA: Diagnosis not present

## 2021-05-04 DIAGNOSIS — R69 Illness, unspecified: Secondary | ICD-10-CM | POA: Diagnosis not present

## 2021-05-05 DIAGNOSIS — R69 Illness, unspecified: Secondary | ICD-10-CM | POA: Diagnosis not present

## 2021-05-06 DIAGNOSIS — R69 Illness, unspecified: Secondary | ICD-10-CM | POA: Diagnosis not present

## 2021-05-07 DIAGNOSIS — R69 Illness, unspecified: Secondary | ICD-10-CM | POA: Diagnosis not present

## 2021-05-08 DIAGNOSIS — R69 Illness, unspecified: Secondary | ICD-10-CM | POA: Diagnosis not present

## 2021-05-09 DIAGNOSIS — R69 Illness, unspecified: Secondary | ICD-10-CM | POA: Diagnosis not present

## 2021-05-10 DIAGNOSIS — R69 Illness, unspecified: Secondary | ICD-10-CM | POA: Diagnosis not present

## 2021-05-11 DIAGNOSIS — R69 Illness, unspecified: Secondary | ICD-10-CM | POA: Diagnosis not present

## 2021-05-12 DIAGNOSIS — R69 Illness, unspecified: Secondary | ICD-10-CM | POA: Diagnosis not present

## 2021-05-13 DIAGNOSIS — R69 Illness, unspecified: Secondary | ICD-10-CM | POA: Diagnosis not present

## 2021-05-13 DIAGNOSIS — R4189 Other symptoms and signs involving cognitive functions and awareness: Secondary | ICD-10-CM | POA: Diagnosis not present

## 2021-05-14 DIAGNOSIS — R69 Illness, unspecified: Secondary | ICD-10-CM | POA: Diagnosis not present

## 2021-05-15 DIAGNOSIS — R69 Illness, unspecified: Secondary | ICD-10-CM | POA: Diagnosis not present

## 2021-05-16 DIAGNOSIS — R69 Illness, unspecified: Secondary | ICD-10-CM | POA: Diagnosis not present

## 2021-05-17 DIAGNOSIS — R69 Illness, unspecified: Secondary | ICD-10-CM | POA: Diagnosis not present

## 2021-05-18 DIAGNOSIS — R69 Illness, unspecified: Secondary | ICD-10-CM | POA: Diagnosis not present

## 2021-05-19 DIAGNOSIS — R69 Illness, unspecified: Secondary | ICD-10-CM | POA: Diagnosis not present

## 2021-05-20 DIAGNOSIS — R69 Illness, unspecified: Secondary | ICD-10-CM | POA: Diagnosis not present

## 2021-05-21 DIAGNOSIS — R69 Illness, unspecified: Secondary | ICD-10-CM | POA: Diagnosis not present

## 2021-05-22 DIAGNOSIS — R69 Illness, unspecified: Secondary | ICD-10-CM | POA: Diagnosis not present

## 2021-05-23 DIAGNOSIS — R69 Illness, unspecified: Secondary | ICD-10-CM | POA: Diagnosis not present

## 2021-05-24 DIAGNOSIS — R69 Illness, unspecified: Secondary | ICD-10-CM | POA: Diagnosis not present

## 2021-05-25 DIAGNOSIS — R69 Illness, unspecified: Secondary | ICD-10-CM | POA: Diagnosis not present

## 2021-05-26 DIAGNOSIS — R69 Illness, unspecified: Secondary | ICD-10-CM | POA: Diagnosis not present

## 2021-05-27 DIAGNOSIS — R69 Illness, unspecified: Secondary | ICD-10-CM | POA: Diagnosis not present

## 2021-05-28 DIAGNOSIS — R69 Illness, unspecified: Secondary | ICD-10-CM | POA: Diagnosis not present

## 2021-05-29 DIAGNOSIS — R69 Illness, unspecified: Secondary | ICD-10-CM | POA: Diagnosis not present

## 2021-05-30 DIAGNOSIS — R69 Illness, unspecified: Secondary | ICD-10-CM | POA: Diagnosis not present

## 2021-05-31 DIAGNOSIS — R69 Illness, unspecified: Secondary | ICD-10-CM | POA: Diagnosis not present

## 2021-06-01 DIAGNOSIS — R69 Illness, unspecified: Secondary | ICD-10-CM | POA: Diagnosis not present

## 2021-06-02 DIAGNOSIS — R69 Illness, unspecified: Secondary | ICD-10-CM | POA: Diagnosis not present

## 2021-06-03 DIAGNOSIS — Z741 Need for assistance with personal care: Secondary | ICD-10-CM | POA: Diagnosis not present

## 2021-06-03 DIAGNOSIS — R2689 Other abnormalities of gait and mobility: Secondary | ICD-10-CM | POA: Diagnosis not present

## 2021-06-03 DIAGNOSIS — R69 Illness, unspecified: Secondary | ICD-10-CM | POA: Diagnosis not present

## 2021-06-03 DIAGNOSIS — F0150 Vascular dementia without behavioral disturbance: Secondary | ICD-10-CM | POA: Diagnosis not present

## 2021-06-04 DIAGNOSIS — R69 Illness, unspecified: Secondary | ICD-10-CM | POA: Diagnosis not present

## 2021-06-05 DIAGNOSIS — R69 Illness, unspecified: Secondary | ICD-10-CM | POA: Diagnosis not present

## 2021-06-06 DIAGNOSIS — R69 Illness, unspecified: Secondary | ICD-10-CM | POA: Diagnosis not present

## 2021-06-06 DIAGNOSIS — R262 Difficulty in walking, not elsewhere classified: Secondary | ICD-10-CM | POA: Diagnosis not present

## 2021-06-06 DIAGNOSIS — R2681 Unsteadiness on feet: Secondary | ICD-10-CM | POA: Diagnosis not present

## 2021-06-07 DIAGNOSIS — R2681 Unsteadiness on feet: Secondary | ICD-10-CM | POA: Diagnosis not present

## 2021-06-07 DIAGNOSIS — R262 Difficulty in walking, not elsewhere classified: Secondary | ICD-10-CM | POA: Diagnosis not present

## 2021-06-07 DIAGNOSIS — R69 Illness, unspecified: Secondary | ICD-10-CM | POA: Diagnosis not present

## 2021-06-08 DIAGNOSIS — R69 Illness, unspecified: Secondary | ICD-10-CM | POA: Diagnosis not present

## 2021-06-09 DIAGNOSIS — R69 Illness, unspecified: Secondary | ICD-10-CM | POA: Diagnosis not present

## 2021-06-10 DIAGNOSIS — R69 Illness, unspecified: Secondary | ICD-10-CM | POA: Diagnosis not present

## 2021-06-11 DIAGNOSIS — R69 Illness, unspecified: Secondary | ICD-10-CM | POA: Diagnosis not present

## 2021-06-12 DIAGNOSIS — R69 Illness, unspecified: Secondary | ICD-10-CM | POA: Diagnosis not present

## 2021-06-12 DIAGNOSIS — R262 Difficulty in walking, not elsewhere classified: Secondary | ICD-10-CM | POA: Diagnosis not present

## 2021-06-12 DIAGNOSIS — R2681 Unsteadiness on feet: Secondary | ICD-10-CM | POA: Diagnosis not present

## 2021-06-13 DIAGNOSIS — R69 Illness, unspecified: Secondary | ICD-10-CM | POA: Diagnosis not present

## 2021-06-14 DIAGNOSIS — R69 Illness, unspecified: Secondary | ICD-10-CM | POA: Diagnosis not present

## 2021-06-15 DIAGNOSIS — R69 Illness, unspecified: Secondary | ICD-10-CM | POA: Diagnosis not present

## 2021-06-15 DIAGNOSIS — R262 Difficulty in walking, not elsewhere classified: Secondary | ICD-10-CM | POA: Diagnosis not present

## 2021-06-15 DIAGNOSIS — R2681 Unsteadiness on feet: Secondary | ICD-10-CM | POA: Diagnosis not present

## 2021-06-16 DIAGNOSIS — Z741 Need for assistance with personal care: Secondary | ICD-10-CM | POA: Diagnosis not present

## 2021-06-16 DIAGNOSIS — R69 Illness, unspecified: Secondary | ICD-10-CM | POA: Diagnosis not present

## 2021-06-16 DIAGNOSIS — R4182 Altered mental status, unspecified: Secondary | ICD-10-CM | POA: Diagnosis not present

## 2021-06-16 DIAGNOSIS — Z87440 Personal history of urinary (tract) infections: Secondary | ICD-10-CM | POA: Diagnosis not present

## 2021-06-17 DIAGNOSIS — R4189 Other symptoms and signs involving cognitive functions and awareness: Secondary | ICD-10-CM | POA: Diagnosis not present

## 2021-06-17 DIAGNOSIS — R69 Illness, unspecified: Secondary | ICD-10-CM | POA: Diagnosis not present

## 2021-06-17 DIAGNOSIS — F331 Major depressive disorder, recurrent, moderate: Secondary | ICD-10-CM | POA: Diagnosis not present

## 2021-06-18 DIAGNOSIS — R69 Illness, unspecified: Secondary | ICD-10-CM | POA: Diagnosis not present

## 2021-06-18 DIAGNOSIS — R262 Difficulty in walking, not elsewhere classified: Secondary | ICD-10-CM | POA: Diagnosis not present

## 2021-06-18 DIAGNOSIS — R2681 Unsteadiness on feet: Secondary | ICD-10-CM | POA: Diagnosis not present

## 2021-06-19 DIAGNOSIS — R2681 Unsteadiness on feet: Secondary | ICD-10-CM | POA: Diagnosis not present

## 2021-06-19 DIAGNOSIS — R69 Illness, unspecified: Secondary | ICD-10-CM | POA: Diagnosis not present

## 2021-06-19 DIAGNOSIS — R262 Difficulty in walking, not elsewhere classified: Secondary | ICD-10-CM | POA: Diagnosis not present

## 2021-06-20 DIAGNOSIS — R69 Illness, unspecified: Secondary | ICD-10-CM | POA: Diagnosis not present

## 2021-06-21 DIAGNOSIS — R262 Difficulty in walking, not elsewhere classified: Secondary | ICD-10-CM | POA: Diagnosis not present

## 2021-06-21 DIAGNOSIS — R2681 Unsteadiness on feet: Secondary | ICD-10-CM | POA: Diagnosis not present

## 2021-06-21 DIAGNOSIS — R69 Illness, unspecified: Secondary | ICD-10-CM | POA: Diagnosis not present

## 2021-06-22 DIAGNOSIS — R69 Illness, unspecified: Secondary | ICD-10-CM | POA: Diagnosis not present

## 2021-06-23 DIAGNOSIS — R69 Illness, unspecified: Secondary | ICD-10-CM | POA: Diagnosis not present

## 2021-06-24 DIAGNOSIS — R69 Illness, unspecified: Secondary | ICD-10-CM | POA: Diagnosis not present

## 2021-06-25 DIAGNOSIS — R2681 Unsteadiness on feet: Secondary | ICD-10-CM | POA: Diagnosis not present

## 2021-06-25 DIAGNOSIS — R262 Difficulty in walking, not elsewhere classified: Secondary | ICD-10-CM | POA: Diagnosis not present

## 2021-06-25 DIAGNOSIS — R300 Dysuria: Secondary | ICD-10-CM | POA: Diagnosis not present

## 2021-06-25 DIAGNOSIS — R69 Illness, unspecified: Secondary | ICD-10-CM | POA: Diagnosis not present

## 2021-06-26 DIAGNOSIS — R69 Illness, unspecified: Secondary | ICD-10-CM | POA: Diagnosis not present

## 2021-06-26 DIAGNOSIS — R2681 Unsteadiness on feet: Secondary | ICD-10-CM | POA: Diagnosis not present

## 2021-06-26 DIAGNOSIS — R262 Difficulty in walking, not elsewhere classified: Secondary | ICD-10-CM | POA: Diagnosis not present

## 2021-06-27 DIAGNOSIS — R69 Illness, unspecified: Secondary | ICD-10-CM | POA: Diagnosis not present

## 2021-06-28 DIAGNOSIS — R69 Illness, unspecified: Secondary | ICD-10-CM | POA: Diagnosis not present

## 2021-06-28 DIAGNOSIS — R2681 Unsteadiness on feet: Secondary | ICD-10-CM | POA: Diagnosis not present

## 2021-06-28 DIAGNOSIS — R262 Difficulty in walking, not elsewhere classified: Secondary | ICD-10-CM | POA: Diagnosis not present

## 2021-06-29 DIAGNOSIS — R69 Illness, unspecified: Secondary | ICD-10-CM | POA: Diagnosis not present

## 2021-06-30 DIAGNOSIS — R69 Illness, unspecified: Secondary | ICD-10-CM | POA: Diagnosis not present

## 2021-07-01 DIAGNOSIS — R69 Illness, unspecified: Secondary | ICD-10-CM | POA: Diagnosis not present

## 2021-07-02 DIAGNOSIS — R2681 Unsteadiness on feet: Secondary | ICD-10-CM | POA: Diagnosis not present

## 2021-07-02 DIAGNOSIS — R262 Difficulty in walking, not elsewhere classified: Secondary | ICD-10-CM | POA: Diagnosis not present

## 2021-07-02 DIAGNOSIS — R69 Illness, unspecified: Secondary | ICD-10-CM | POA: Diagnosis not present

## 2021-07-03 DIAGNOSIS — R2681 Unsteadiness on feet: Secondary | ICD-10-CM | POA: Diagnosis not present

## 2021-07-03 DIAGNOSIS — R69 Illness, unspecified: Secondary | ICD-10-CM | POA: Diagnosis not present

## 2021-07-03 DIAGNOSIS — R262 Difficulty in walking, not elsewhere classified: Secondary | ICD-10-CM | POA: Diagnosis not present

## 2021-07-04 DIAGNOSIS — R69 Illness, unspecified: Secondary | ICD-10-CM | POA: Diagnosis not present

## 2021-07-05 DIAGNOSIS — R69 Illness, unspecified: Secondary | ICD-10-CM | POA: Diagnosis not present

## 2021-07-06 DIAGNOSIS — R69 Illness, unspecified: Secondary | ICD-10-CM | POA: Diagnosis not present

## 2021-07-07 DIAGNOSIS — R69 Illness, unspecified: Secondary | ICD-10-CM | POA: Diagnosis not present

## 2021-07-07 DIAGNOSIS — R262 Difficulty in walking, not elsewhere classified: Secondary | ICD-10-CM | POA: Diagnosis not present

## 2021-07-07 DIAGNOSIS — R2681 Unsteadiness on feet: Secondary | ICD-10-CM | POA: Diagnosis not present

## 2021-07-08 DIAGNOSIS — R2681 Unsteadiness on feet: Secondary | ICD-10-CM | POA: Diagnosis not present

## 2021-07-08 DIAGNOSIS — R262 Difficulty in walking, not elsewhere classified: Secondary | ICD-10-CM | POA: Diagnosis not present

## 2021-07-08 DIAGNOSIS — R69 Illness, unspecified: Secondary | ICD-10-CM | POA: Diagnosis not present

## 2021-07-09 DIAGNOSIS — R2681 Unsteadiness on feet: Secondary | ICD-10-CM | POA: Diagnosis not present

## 2021-07-09 DIAGNOSIS — R262 Difficulty in walking, not elsewhere classified: Secondary | ICD-10-CM | POA: Diagnosis not present

## 2021-07-09 DIAGNOSIS — R69 Illness, unspecified: Secondary | ICD-10-CM | POA: Diagnosis not present

## 2021-07-10 DIAGNOSIS — R69 Illness, unspecified: Secondary | ICD-10-CM | POA: Diagnosis not present

## 2021-07-11 DIAGNOSIS — R69 Illness, unspecified: Secondary | ICD-10-CM | POA: Diagnosis not present

## 2021-07-12 DIAGNOSIS — R69 Illness, unspecified: Secondary | ICD-10-CM | POA: Diagnosis not present

## 2021-07-13 DIAGNOSIS — R69 Illness, unspecified: Secondary | ICD-10-CM | POA: Diagnosis not present

## 2021-07-14 DIAGNOSIS — R69 Illness, unspecified: Secondary | ICD-10-CM | POA: Diagnosis not present

## 2021-07-14 DIAGNOSIS — K640 First degree hemorrhoids: Secondary | ICD-10-CM | POA: Diagnosis not present

## 2021-07-14 DIAGNOSIS — D1724 Benign lipomatous neoplasm of skin and subcutaneous tissue of left leg: Secondary | ICD-10-CM | POA: Diagnosis not present

## 2021-07-15 DIAGNOSIS — R69 Illness, unspecified: Secondary | ICD-10-CM | POA: Diagnosis not present

## 2021-07-16 DIAGNOSIS — R69 Illness, unspecified: Secondary | ICD-10-CM | POA: Diagnosis not present

## 2021-07-17 DIAGNOSIS — R69 Illness, unspecified: Secondary | ICD-10-CM | POA: Diagnosis not present

## 2021-07-17 DIAGNOSIS — F03C3 Unspecified dementia, severe, with mood disturbance: Secondary | ICD-10-CM | POA: Diagnosis not present

## 2021-07-17 DIAGNOSIS — R4189 Other symptoms and signs involving cognitive functions and awareness: Secondary | ICD-10-CM | POA: Diagnosis not present

## 2021-07-18 DIAGNOSIS — R69 Illness, unspecified: Secondary | ICD-10-CM | POA: Diagnosis not present

## 2021-07-19 DIAGNOSIS — R69 Illness, unspecified: Secondary | ICD-10-CM | POA: Diagnosis not present

## 2021-07-20 DIAGNOSIS — R69 Illness, unspecified: Secondary | ICD-10-CM | POA: Diagnosis not present

## 2021-07-21 DIAGNOSIS — R69 Illness, unspecified: Secondary | ICD-10-CM | POA: Diagnosis not present

## 2021-07-22 DIAGNOSIS — R69 Illness, unspecified: Secondary | ICD-10-CM | POA: Diagnosis not present

## 2021-07-23 DIAGNOSIS — R69 Illness, unspecified: Secondary | ICD-10-CM | POA: Diagnosis not present

## 2021-07-24 DIAGNOSIS — R69 Illness, unspecified: Secondary | ICD-10-CM | POA: Diagnosis not present

## 2021-07-25 DIAGNOSIS — R69 Illness, unspecified: Secondary | ICD-10-CM | POA: Diagnosis not present

## 2021-07-26 DIAGNOSIS — R69 Illness, unspecified: Secondary | ICD-10-CM | POA: Diagnosis not present

## 2021-07-27 DIAGNOSIS — R69 Illness, unspecified: Secondary | ICD-10-CM | POA: Diagnosis not present

## 2021-07-28 DIAGNOSIS — R69 Illness, unspecified: Secondary | ICD-10-CM | POA: Diagnosis not present

## 2021-07-29 DIAGNOSIS — R2689 Other abnormalities of gait and mobility: Secondary | ICD-10-CM | POA: Diagnosis not present

## 2021-07-29 DIAGNOSIS — R69 Illness, unspecified: Secondary | ICD-10-CM | POA: Diagnosis not present

## 2021-07-29 DIAGNOSIS — F0150 Vascular dementia without behavioral disturbance: Secondary | ICD-10-CM | POA: Diagnosis not present

## 2021-07-30 DIAGNOSIS — R69 Illness, unspecified: Secondary | ICD-10-CM | POA: Diagnosis not present

## 2021-07-31 DIAGNOSIS — R69 Illness, unspecified: Secondary | ICD-10-CM | POA: Diagnosis not present

## 2021-08-01 DIAGNOSIS — R69 Illness, unspecified: Secondary | ICD-10-CM | POA: Diagnosis not present

## 2021-08-02 DIAGNOSIS — R69 Illness, unspecified: Secondary | ICD-10-CM | POA: Diagnosis not present

## 2021-08-03 DIAGNOSIS — R69 Illness, unspecified: Secondary | ICD-10-CM | POA: Diagnosis not present

## 2021-08-04 DIAGNOSIS — R69 Illness, unspecified: Secondary | ICD-10-CM | POA: Diagnosis not present

## 2021-08-04 DIAGNOSIS — R2689 Other abnormalities of gait and mobility: Secondary | ICD-10-CM | POA: Diagnosis not present

## 2021-08-05 DIAGNOSIS — R69 Illness, unspecified: Secondary | ICD-10-CM | POA: Diagnosis not present

## 2021-08-06 DIAGNOSIS — R69 Illness, unspecified: Secondary | ICD-10-CM | POA: Diagnosis not present

## 2021-08-07 DIAGNOSIS — R69 Illness, unspecified: Secondary | ICD-10-CM | POA: Diagnosis not present

## 2021-08-07 DIAGNOSIS — H2513 Age-related nuclear cataract, bilateral: Secondary | ICD-10-CM | POA: Diagnosis not present

## 2021-08-07 DIAGNOSIS — H25013 Cortical age-related cataract, bilateral: Secondary | ICD-10-CM | POA: Diagnosis not present

## 2021-08-08 DIAGNOSIS — R69 Illness, unspecified: Secondary | ICD-10-CM | POA: Diagnosis not present

## 2021-08-09 DIAGNOSIS — R69 Illness, unspecified: Secondary | ICD-10-CM | POA: Diagnosis not present

## 2021-08-10 DIAGNOSIS — R69 Illness, unspecified: Secondary | ICD-10-CM | POA: Diagnosis not present

## 2021-08-11 DIAGNOSIS — R69 Illness, unspecified: Secondary | ICD-10-CM | POA: Diagnosis not present

## 2021-08-12 DIAGNOSIS — R69 Illness, unspecified: Secondary | ICD-10-CM | POA: Diagnosis not present

## 2021-08-13 DIAGNOSIS — R69 Illness, unspecified: Secondary | ICD-10-CM | POA: Diagnosis not present

## 2021-08-14 DIAGNOSIS — F03B18 Unspecified dementia, moderate, with other behavioral disturbance: Secondary | ICD-10-CM | POA: Diagnosis not present

## 2021-08-14 DIAGNOSIS — F331 Major depressive disorder, recurrent, moderate: Secondary | ICD-10-CM | POA: Diagnosis not present

## 2021-08-14 DIAGNOSIS — R69 Illness, unspecified: Secondary | ICD-10-CM | POA: Diagnosis not present

## 2021-08-15 DIAGNOSIS — G309 Alzheimer's disease, unspecified: Secondary | ICD-10-CM | POA: Diagnosis not present

## 2021-08-15 DIAGNOSIS — Z6822 Body mass index (BMI) 22.0-22.9, adult: Secondary | ICD-10-CM | POA: Diagnosis not present

## 2021-08-15 DIAGNOSIS — R634 Abnormal weight loss: Secondary | ICD-10-CM | POA: Diagnosis not present

## 2021-08-15 DIAGNOSIS — K640 First degree hemorrhoids: Secondary | ICD-10-CM | POA: Diagnosis not present

## 2021-08-15 DIAGNOSIS — R1311 Dysphagia, oral phase: Secondary | ICD-10-CM | POA: Diagnosis not present

## 2021-08-15 DIAGNOSIS — Z87440 Personal history of urinary (tract) infections: Secondary | ICD-10-CM | POA: Diagnosis not present

## 2021-08-15 DIAGNOSIS — Z86018 Personal history of other benign neoplasm: Secondary | ICD-10-CM | POA: Diagnosis not present

## 2021-08-15 DIAGNOSIS — R69 Illness, unspecified: Secondary | ICD-10-CM | POA: Diagnosis not present

## 2021-08-16 DIAGNOSIS — R69 Illness, unspecified: Secondary | ICD-10-CM | POA: Diagnosis not present

## 2021-08-17 DIAGNOSIS — R69 Illness, unspecified: Secondary | ICD-10-CM | POA: Diagnosis not present

## 2021-08-18 DIAGNOSIS — Z86018 Personal history of other benign neoplasm: Secondary | ICD-10-CM | POA: Diagnosis not present

## 2021-08-18 DIAGNOSIS — K640 First degree hemorrhoids: Secondary | ICD-10-CM | POA: Diagnosis not present

## 2021-08-18 DIAGNOSIS — R69 Illness, unspecified: Secondary | ICD-10-CM | POA: Diagnosis not present

## 2021-08-18 DIAGNOSIS — G309 Alzheimer's disease, unspecified: Secondary | ICD-10-CM | POA: Diagnosis not present

## 2021-08-18 DIAGNOSIS — Z6822 Body mass index (BMI) 22.0-22.9, adult: Secondary | ICD-10-CM | POA: Diagnosis not present

## 2021-08-18 DIAGNOSIS — Z87440 Personal history of urinary (tract) infections: Secondary | ICD-10-CM | POA: Diagnosis not present

## 2021-08-18 DIAGNOSIS — R1311 Dysphagia, oral phase: Secondary | ICD-10-CM | POA: Diagnosis not present

## 2021-08-18 DIAGNOSIS — R634 Abnormal weight loss: Secondary | ICD-10-CM | POA: Diagnosis not present

## 2021-08-19 DIAGNOSIS — Z6822 Body mass index (BMI) 22.0-22.9, adult: Secondary | ICD-10-CM | POA: Diagnosis not present

## 2021-08-19 DIAGNOSIS — R634 Abnormal weight loss: Secondary | ICD-10-CM | POA: Diagnosis not present

## 2021-08-19 DIAGNOSIS — K640 First degree hemorrhoids: Secondary | ICD-10-CM | POA: Diagnosis not present

## 2021-08-19 DIAGNOSIS — G309 Alzheimer's disease, unspecified: Secondary | ICD-10-CM | POA: Diagnosis not present

## 2021-08-19 DIAGNOSIS — R69 Illness, unspecified: Secondary | ICD-10-CM | POA: Diagnosis not present

## 2021-08-19 DIAGNOSIS — Z87440 Personal history of urinary (tract) infections: Secondary | ICD-10-CM | POA: Diagnosis not present

## 2021-08-19 DIAGNOSIS — Z86018 Personal history of other benign neoplasm: Secondary | ICD-10-CM | POA: Diagnosis not present

## 2021-08-19 DIAGNOSIS — R1311 Dysphagia, oral phase: Secondary | ICD-10-CM | POA: Diagnosis not present

## 2021-08-20 DIAGNOSIS — R69 Illness, unspecified: Secondary | ICD-10-CM | POA: Diagnosis not present

## 2021-08-21 DIAGNOSIS — R69 Illness, unspecified: Secondary | ICD-10-CM | POA: Diagnosis not present

## 2021-08-22 DIAGNOSIS — R69 Illness, unspecified: Secondary | ICD-10-CM | POA: Diagnosis not present

## 2021-08-23 DIAGNOSIS — R69 Illness, unspecified: Secondary | ICD-10-CM | POA: Diagnosis not present

## 2021-08-24 DIAGNOSIS — R69 Illness, unspecified: Secondary | ICD-10-CM | POA: Diagnosis not present

## 2021-08-25 DIAGNOSIS — R69 Illness, unspecified: Secondary | ICD-10-CM | POA: Diagnosis not present

## 2021-08-26 DIAGNOSIS — R69 Illness, unspecified: Secondary | ICD-10-CM | POA: Diagnosis not present

## 2021-08-27 DIAGNOSIS — R69 Illness, unspecified: Secondary | ICD-10-CM | POA: Diagnosis not present

## 2021-08-28 DIAGNOSIS — R69 Illness, unspecified: Secondary | ICD-10-CM | POA: Diagnosis not present

## 2021-08-29 DIAGNOSIS — R634 Abnormal weight loss: Secondary | ICD-10-CM | POA: Diagnosis not present

## 2021-08-29 DIAGNOSIS — R1311 Dysphagia, oral phase: Secondary | ICD-10-CM | POA: Diagnosis not present

## 2021-08-29 DIAGNOSIS — Z86018 Personal history of other benign neoplasm: Secondary | ICD-10-CM | POA: Diagnosis not present

## 2021-08-29 DIAGNOSIS — K640 First degree hemorrhoids: Secondary | ICD-10-CM | POA: Diagnosis not present

## 2021-08-29 DIAGNOSIS — R69 Illness, unspecified: Secondary | ICD-10-CM | POA: Diagnosis not present

## 2021-08-29 DIAGNOSIS — Z6822 Body mass index (BMI) 22.0-22.9, adult: Secondary | ICD-10-CM | POA: Diagnosis not present

## 2021-08-29 DIAGNOSIS — Z87440 Personal history of urinary (tract) infections: Secondary | ICD-10-CM | POA: Diagnosis not present

## 2021-08-29 DIAGNOSIS — G309 Alzheimer's disease, unspecified: Secondary | ICD-10-CM | POA: Diagnosis not present

## 2021-08-30 DIAGNOSIS — R69 Illness, unspecified: Secondary | ICD-10-CM | POA: Diagnosis not present

## 2021-08-31 DIAGNOSIS — R69 Illness, unspecified: Secondary | ICD-10-CM | POA: Diagnosis not present

## 2021-09-01 DIAGNOSIS — R69 Illness, unspecified: Secondary | ICD-10-CM | POA: Diagnosis not present

## 2021-09-02 DIAGNOSIS — R69 Illness, unspecified: Secondary | ICD-10-CM | POA: Diagnosis not present

## 2021-09-03 DIAGNOSIS — R69 Illness, unspecified: Secondary | ICD-10-CM | POA: Diagnosis not present

## 2021-09-04 DIAGNOSIS — R69 Illness, unspecified: Secondary | ICD-10-CM | POA: Diagnosis not present

## 2021-09-05 DIAGNOSIS — R69 Illness, unspecified: Secondary | ICD-10-CM | POA: Diagnosis not present

## 2021-09-06 DIAGNOSIS — R69 Illness, unspecified: Secondary | ICD-10-CM | POA: Diagnosis not present

## 2021-09-07 DIAGNOSIS — R69 Illness, unspecified: Secondary | ICD-10-CM | POA: Diagnosis not present

## 2021-09-08 DIAGNOSIS — R634 Abnormal weight loss: Secondary | ICD-10-CM | POA: Diagnosis not present

## 2021-09-08 DIAGNOSIS — R69 Illness, unspecified: Secondary | ICD-10-CM | POA: Diagnosis not present

## 2021-09-08 DIAGNOSIS — Z6822 Body mass index (BMI) 22.0-22.9, adult: Secondary | ICD-10-CM | POA: Diagnosis not present

## 2021-09-08 DIAGNOSIS — G309 Alzheimer's disease, unspecified: Secondary | ICD-10-CM | POA: Diagnosis not present

## 2021-09-08 DIAGNOSIS — Z86018 Personal history of other benign neoplasm: Secondary | ICD-10-CM | POA: Diagnosis not present

## 2021-09-08 DIAGNOSIS — R1311 Dysphagia, oral phase: Secondary | ICD-10-CM | POA: Diagnosis not present

## 2021-09-08 DIAGNOSIS — Z87440 Personal history of urinary (tract) infections: Secondary | ICD-10-CM | POA: Diagnosis not present

## 2021-09-08 DIAGNOSIS — K640 First degree hemorrhoids: Secondary | ICD-10-CM | POA: Diagnosis not present

## 2021-09-09 DIAGNOSIS — R69 Illness, unspecified: Secondary | ICD-10-CM | POA: Diagnosis not present

## 2021-09-10 DIAGNOSIS — R69 Illness, unspecified: Secondary | ICD-10-CM | POA: Diagnosis not present

## 2021-09-11 DIAGNOSIS — R69 Illness, unspecified: Secondary | ICD-10-CM | POA: Diagnosis not present

## 2021-09-12 DIAGNOSIS — F331 Major depressive disorder, recurrent, moderate: Secondary | ICD-10-CM | POA: Diagnosis not present

## 2021-09-12 DIAGNOSIS — R69 Illness, unspecified: Secondary | ICD-10-CM | POA: Diagnosis not present

## 2021-09-12 DIAGNOSIS — F03B18 Unspecified dementia, moderate, with other behavioral disturbance: Secondary | ICD-10-CM | POA: Diagnosis not present

## 2021-09-13 DIAGNOSIS — R69 Illness, unspecified: Secondary | ICD-10-CM | POA: Diagnosis not present

## 2021-09-14 DIAGNOSIS — R69 Illness, unspecified: Secondary | ICD-10-CM | POA: Diagnosis not present

## 2021-09-15 DIAGNOSIS — N3946 Mixed incontinence: Secondary | ICD-10-CM | POA: Diagnosis not present

## 2021-09-15 DIAGNOSIS — R69 Illness, unspecified: Secondary | ICD-10-CM | POA: Diagnosis not present

## 2021-09-16 DIAGNOSIS — R69 Illness, unspecified: Secondary | ICD-10-CM | POA: Diagnosis not present

## 2021-09-17 DIAGNOSIS — R69 Illness, unspecified: Secondary | ICD-10-CM | POA: Diagnosis not present

## 2021-09-17 DIAGNOSIS — Z87440 Personal history of urinary (tract) infections: Secondary | ICD-10-CM | POA: Diagnosis not present

## 2021-09-17 DIAGNOSIS — G309 Alzheimer's disease, unspecified: Secondary | ICD-10-CM | POA: Diagnosis not present

## 2021-09-17 DIAGNOSIS — R634 Abnormal weight loss: Secondary | ICD-10-CM | POA: Diagnosis not present

## 2021-09-17 DIAGNOSIS — Z86018 Personal history of other benign neoplasm: Secondary | ICD-10-CM | POA: Diagnosis not present

## 2021-09-17 DIAGNOSIS — K640 First degree hemorrhoids: Secondary | ICD-10-CM | POA: Diagnosis not present

## 2021-09-17 DIAGNOSIS — R1311 Dysphagia, oral phase: Secondary | ICD-10-CM | POA: Diagnosis not present

## 2021-09-17 DIAGNOSIS — Z6822 Body mass index (BMI) 22.0-22.9, adult: Secondary | ICD-10-CM | POA: Diagnosis not present

## 2021-09-18 DIAGNOSIS — R69 Illness, unspecified: Secondary | ICD-10-CM | POA: Diagnosis not present

## 2021-09-19 DIAGNOSIS — R69 Illness, unspecified: Secondary | ICD-10-CM | POA: Diagnosis not present

## 2021-09-20 DIAGNOSIS — R69 Illness, unspecified: Secondary | ICD-10-CM | POA: Diagnosis not present

## 2021-09-21 DIAGNOSIS — R69 Illness, unspecified: Secondary | ICD-10-CM | POA: Diagnosis not present

## 2021-09-22 DIAGNOSIS — R69 Illness, unspecified: Secondary | ICD-10-CM | POA: Diagnosis not present

## 2021-09-23 DIAGNOSIS — R69 Illness, unspecified: Secondary | ICD-10-CM | POA: Diagnosis not present

## 2021-09-23 DIAGNOSIS — Z6822 Body mass index (BMI) 22.0-22.9, adult: Secondary | ICD-10-CM | POA: Diagnosis not present

## 2021-09-23 DIAGNOSIS — R1311 Dysphagia, oral phase: Secondary | ICD-10-CM | POA: Diagnosis not present

## 2021-09-23 DIAGNOSIS — R634 Abnormal weight loss: Secondary | ICD-10-CM | POA: Diagnosis not present

## 2021-09-23 DIAGNOSIS — Z87440 Personal history of urinary (tract) infections: Secondary | ICD-10-CM | POA: Diagnosis not present

## 2021-09-23 DIAGNOSIS — Z86018 Personal history of other benign neoplasm: Secondary | ICD-10-CM | POA: Diagnosis not present

## 2021-09-23 DIAGNOSIS — G309 Alzheimer's disease, unspecified: Secondary | ICD-10-CM | POA: Diagnosis not present

## 2021-09-23 DIAGNOSIS — K640 First degree hemorrhoids: Secondary | ICD-10-CM | POA: Diagnosis not present

## 2021-09-24 DIAGNOSIS — R69 Illness, unspecified: Secondary | ICD-10-CM | POA: Diagnosis not present

## 2021-09-25 DIAGNOSIS — R69 Illness, unspecified: Secondary | ICD-10-CM | POA: Diagnosis not present

## 2021-09-26 DIAGNOSIS — R69 Illness, unspecified: Secondary | ICD-10-CM | POA: Diagnosis not present

## 2021-09-27 DIAGNOSIS — R69 Illness, unspecified: Secondary | ICD-10-CM | POA: Diagnosis not present

## 2021-09-28 DIAGNOSIS — R69 Illness, unspecified: Secondary | ICD-10-CM | POA: Diagnosis not present

## 2021-09-29 DIAGNOSIS — R69 Illness, unspecified: Secondary | ICD-10-CM | POA: Diagnosis not present

## 2021-09-30 DIAGNOSIS — R69 Illness, unspecified: Secondary | ICD-10-CM | POA: Diagnosis not present

## 2021-10-01 DIAGNOSIS — R69 Illness, unspecified: Secondary | ICD-10-CM | POA: Diagnosis not present

## 2021-10-02 DIAGNOSIS — R69 Illness, unspecified: Secondary | ICD-10-CM | POA: Diagnosis not present

## 2021-10-03 DIAGNOSIS — R69 Illness, unspecified: Secondary | ICD-10-CM | POA: Diagnosis not present

## 2021-10-03 DIAGNOSIS — Z6822 Body mass index (BMI) 22.0-22.9, adult: Secondary | ICD-10-CM | POA: Diagnosis not present

## 2021-10-03 DIAGNOSIS — K640 First degree hemorrhoids: Secondary | ICD-10-CM | POA: Diagnosis not present

## 2021-10-03 DIAGNOSIS — Z87440 Personal history of urinary (tract) infections: Secondary | ICD-10-CM | POA: Diagnosis not present

## 2021-10-03 DIAGNOSIS — R634 Abnormal weight loss: Secondary | ICD-10-CM | POA: Diagnosis not present

## 2021-10-03 DIAGNOSIS — Z86018 Personal history of other benign neoplasm: Secondary | ICD-10-CM | POA: Diagnosis not present

## 2021-10-03 DIAGNOSIS — G309 Alzheimer's disease, unspecified: Secondary | ICD-10-CM | POA: Diagnosis not present

## 2021-10-03 DIAGNOSIS — R1311 Dysphagia, oral phase: Secondary | ICD-10-CM | POA: Diagnosis not present

## 2021-10-04 DIAGNOSIS — R69 Illness, unspecified: Secondary | ICD-10-CM | POA: Diagnosis not present

## 2021-10-05 DIAGNOSIS — R69 Illness, unspecified: Secondary | ICD-10-CM | POA: Diagnosis not present

## 2021-10-06 DIAGNOSIS — R69 Illness, unspecified: Secondary | ICD-10-CM | POA: Diagnosis not present

## 2021-10-07 DIAGNOSIS — R69 Illness, unspecified: Secondary | ICD-10-CM | POA: Diagnosis not present

## 2021-10-08 DIAGNOSIS — R69 Illness, unspecified: Secondary | ICD-10-CM | POA: Diagnosis not present

## 2021-10-09 DIAGNOSIS — R69 Illness, unspecified: Secondary | ICD-10-CM | POA: Diagnosis not present

## 2021-10-10 DIAGNOSIS — R69 Illness, unspecified: Secondary | ICD-10-CM | POA: Diagnosis not present

## 2021-10-11 DIAGNOSIS — R69 Illness, unspecified: Secondary | ICD-10-CM | POA: Diagnosis not present

## 2021-10-12 DIAGNOSIS — R69 Illness, unspecified: Secondary | ICD-10-CM | POA: Diagnosis not present

## 2021-10-13 DIAGNOSIS — R69 Illness, unspecified: Secondary | ICD-10-CM | POA: Diagnosis not present

## 2021-10-14 DIAGNOSIS — F03B18 Unspecified dementia, moderate, with other behavioral disturbance: Secondary | ICD-10-CM | POA: Diagnosis not present

## 2021-10-14 DIAGNOSIS — R69 Illness, unspecified: Secondary | ICD-10-CM | POA: Diagnosis not present

## 2021-10-15 DIAGNOSIS — R69 Illness, unspecified: Secondary | ICD-10-CM | POA: Diagnosis not present

## 2021-10-16 DIAGNOSIS — R69 Illness, unspecified: Secondary | ICD-10-CM | POA: Diagnosis not present

## 2021-10-17 DIAGNOSIS — R69 Illness, unspecified: Secondary | ICD-10-CM | POA: Diagnosis not present

## 2021-10-18 DIAGNOSIS — R69 Illness, unspecified: Secondary | ICD-10-CM | POA: Diagnosis not present

## 2021-10-19 DIAGNOSIS — R69 Illness, unspecified: Secondary | ICD-10-CM | POA: Diagnosis not present

## 2021-10-20 DIAGNOSIS — R69 Illness, unspecified: Secondary | ICD-10-CM | POA: Diagnosis not present

## 2021-10-21 DIAGNOSIS — R69 Illness, unspecified: Secondary | ICD-10-CM | POA: Diagnosis not present

## 2021-10-22 DIAGNOSIS — R69 Illness, unspecified: Secondary | ICD-10-CM | POA: Diagnosis not present

## 2021-10-23 DIAGNOSIS — R69 Illness, unspecified: Secondary | ICD-10-CM | POA: Diagnosis not present

## 2021-10-24 DIAGNOSIS — R69 Illness, unspecified: Secondary | ICD-10-CM | POA: Diagnosis not present

## 2021-10-25 DIAGNOSIS — R69 Illness, unspecified: Secondary | ICD-10-CM | POA: Diagnosis not present

## 2021-10-26 DIAGNOSIS — R69 Illness, unspecified: Secondary | ICD-10-CM | POA: Diagnosis not present

## 2021-10-27 DIAGNOSIS — R69 Illness, unspecified: Secondary | ICD-10-CM | POA: Diagnosis not present

## 2021-10-28 DIAGNOSIS — R69 Illness, unspecified: Secondary | ICD-10-CM | POA: Diagnosis not present

## 2021-11-13 DIAGNOSIS — F03B18 Unspecified dementia, moderate, with other behavioral disturbance: Secondary | ICD-10-CM | POA: Diagnosis not present

## 2021-11-13 DIAGNOSIS — F331 Major depressive disorder, recurrent, moderate: Secondary | ICD-10-CM | POA: Diagnosis not present

## 2021-11-13 DIAGNOSIS — R69 Illness, unspecified: Secondary | ICD-10-CM | POA: Diagnosis not present

## 2021-11-28 DIAGNOSIS — R69 Illness, unspecified: Secondary | ICD-10-CM | POA: Diagnosis not present

## 2021-11-29 DIAGNOSIS — R69 Illness, unspecified: Secondary | ICD-10-CM | POA: Diagnosis not present

## 2021-11-30 DIAGNOSIS — R69 Illness, unspecified: Secondary | ICD-10-CM | POA: Diagnosis not present

## 2021-12-01 DIAGNOSIS — R69 Illness, unspecified: Secondary | ICD-10-CM | POA: Diagnosis not present

## 2021-12-02 DIAGNOSIS — R69 Illness, unspecified: Secondary | ICD-10-CM | POA: Diagnosis not present

## 2021-12-03 DIAGNOSIS — R69 Illness, unspecified: Secondary | ICD-10-CM | POA: Diagnosis not present

## 2021-12-04 DIAGNOSIS — R69 Illness, unspecified: Secondary | ICD-10-CM | POA: Diagnosis not present

## 2021-12-05 DIAGNOSIS — R69 Illness, unspecified: Secondary | ICD-10-CM | POA: Diagnosis not present

## 2021-12-06 DIAGNOSIS — R69 Illness, unspecified: Secondary | ICD-10-CM | POA: Diagnosis not present

## 2021-12-07 DIAGNOSIS — R69 Illness, unspecified: Secondary | ICD-10-CM | POA: Diagnosis not present

## 2021-12-08 DIAGNOSIS — R69 Illness, unspecified: Secondary | ICD-10-CM | POA: Diagnosis not present

## 2021-12-09 DIAGNOSIS — R69 Illness, unspecified: Secondary | ICD-10-CM | POA: Diagnosis not present

## 2021-12-10 DIAGNOSIS — R69 Illness, unspecified: Secondary | ICD-10-CM | POA: Diagnosis not present

## 2021-12-11 DIAGNOSIS — R69 Illness, unspecified: Secondary | ICD-10-CM | POA: Diagnosis not present

## 2021-12-11 IMAGING — CR DG CHEST 2V
2 series · 2 of 2 positions shown · non-contrast
Comparison: None

CLINICAL DATA: Weakness and altered mental status

EXAM:
CHEST - 2 VIEW

[w chest lat]
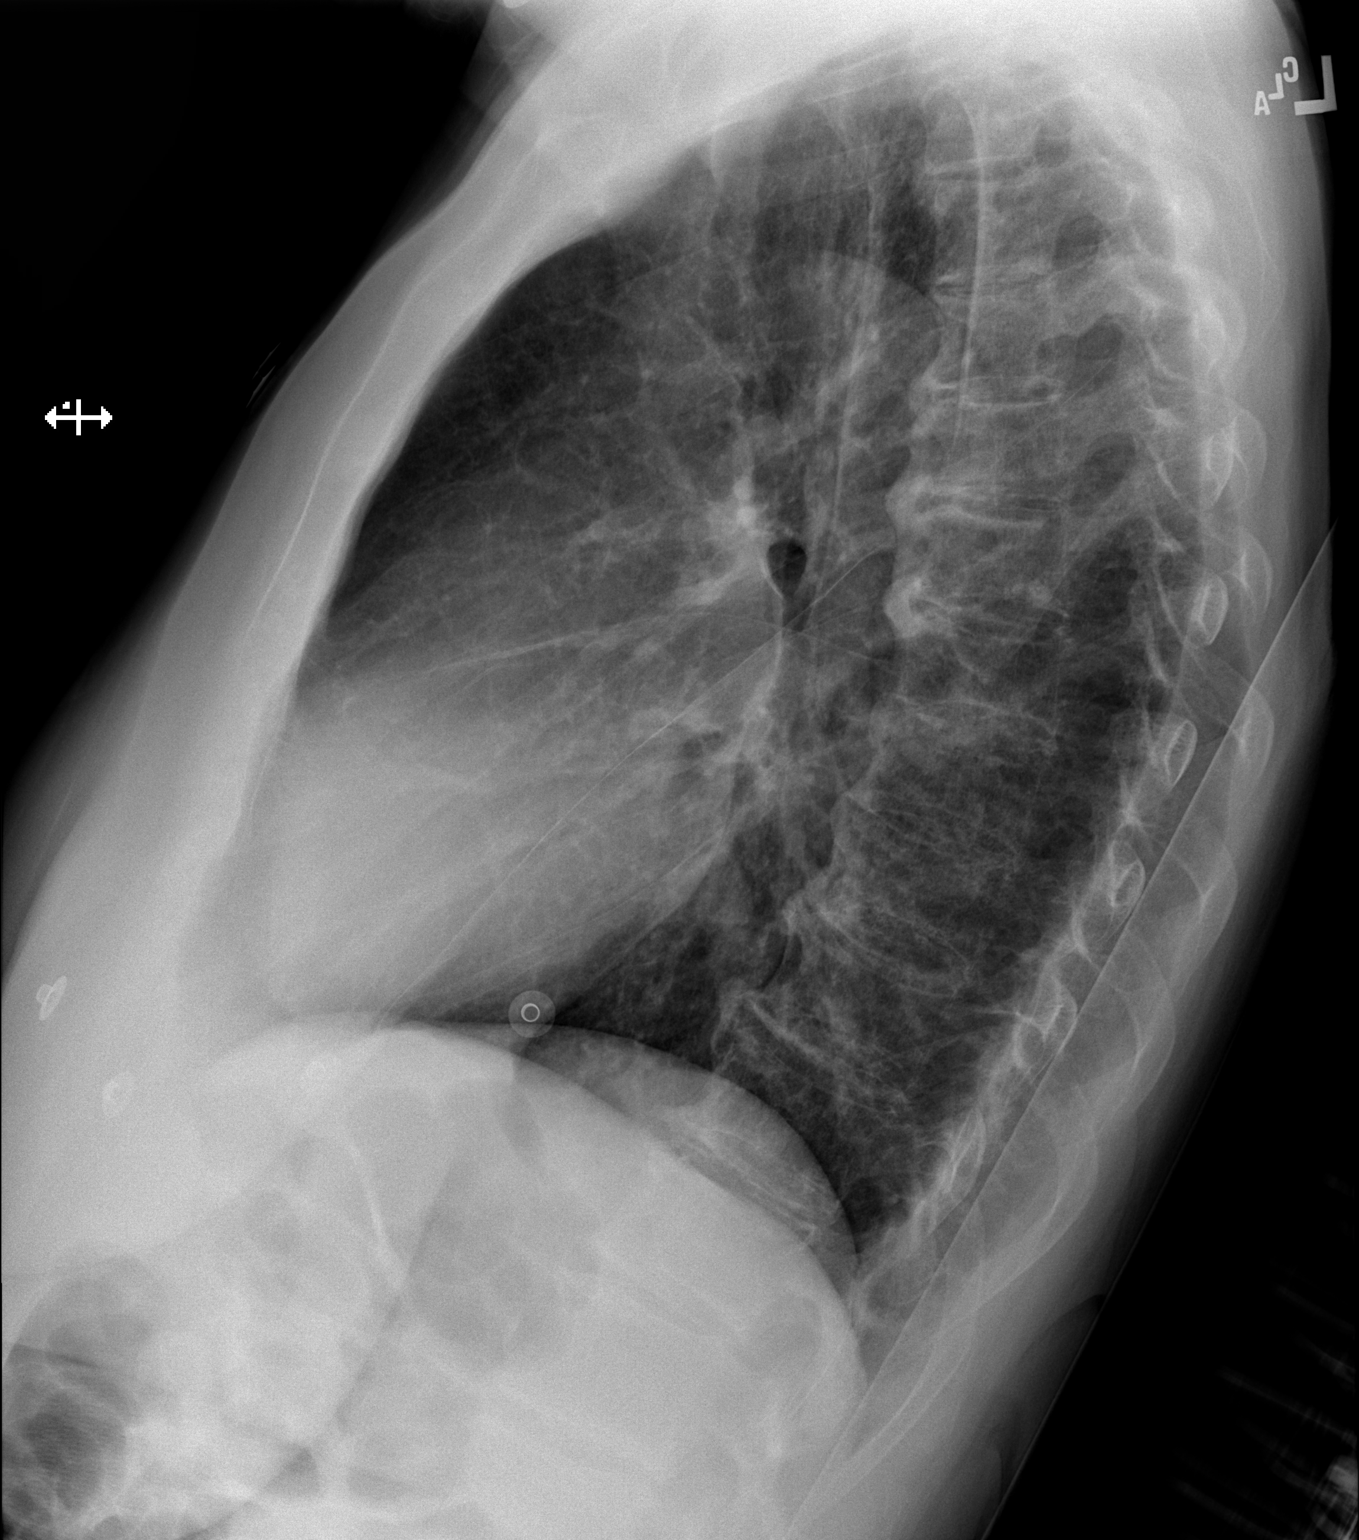

[x chest ap]
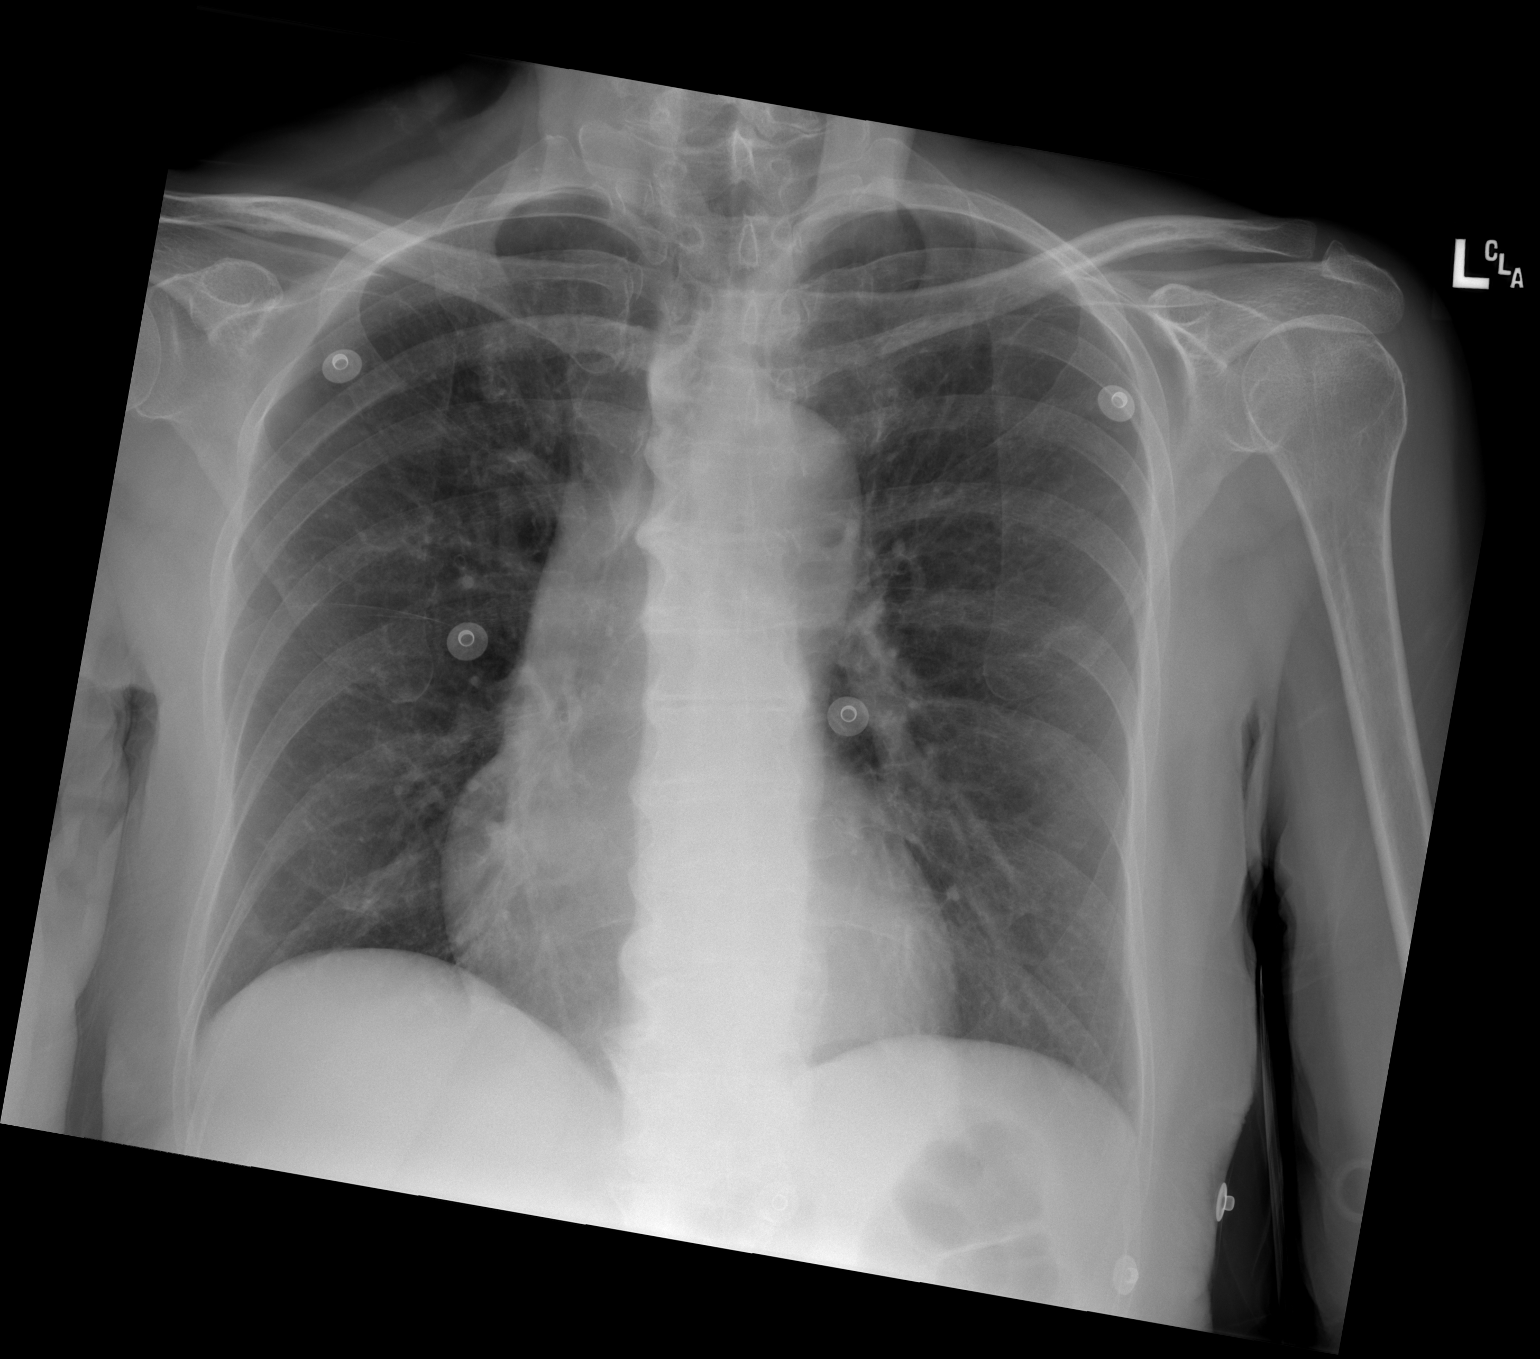

[2 of 2 positions shown; findings below may reference images not displayed]

FINDINGS: Images rotated to the RIGHT. Accounting for this rotation
cardiomediastinal contours and hilar structures are normal.

Lungs are clear.  No sign of pleural effusion.

Spinal degenerative changes, limited skeletal assessment otherwise
unremarkable.
IMPRESSION: No active cardiopulmonary disease.

## 2021-12-11 IMAGING — CT CT HEAD W/O CM
3 series · 16 of 46 positions shown, 19 images · non-contrast
Comparison: 03/08/2020

CLINICAL DATA: Mental status change with unknown cause

EXAM:
CT HEAD WITHOUT CONTRAST
TECHNIQUE: Contiguous axial images were obtained from the base of the skull
through the vertex without intravenous contrast.

[Series 2: head wo · axial · 0.42mm/px · z∈[-161,-41]mm · 10 of 29 slices shown, 13 images]
[im 3/29  brain]
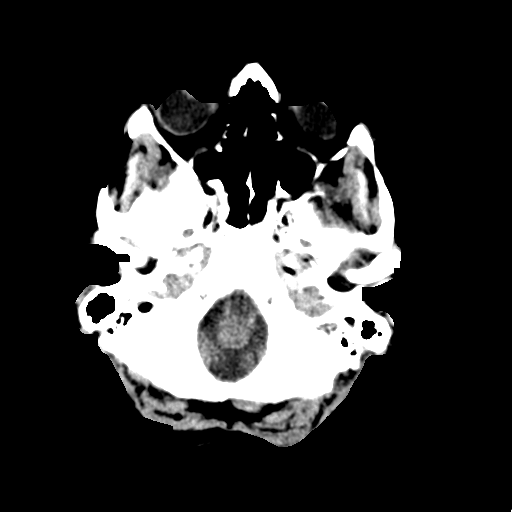
[im 3/29  bone]
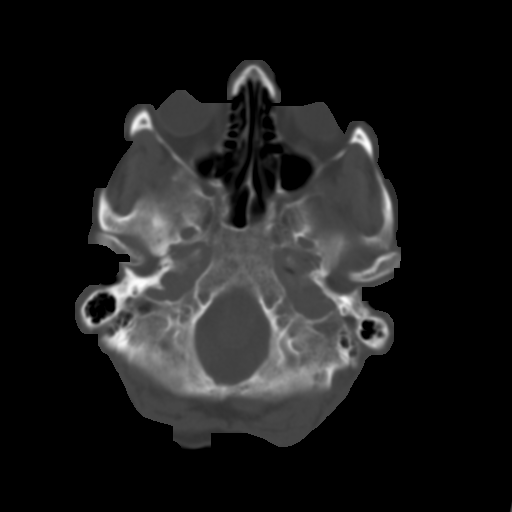
[im 6/29  brain]
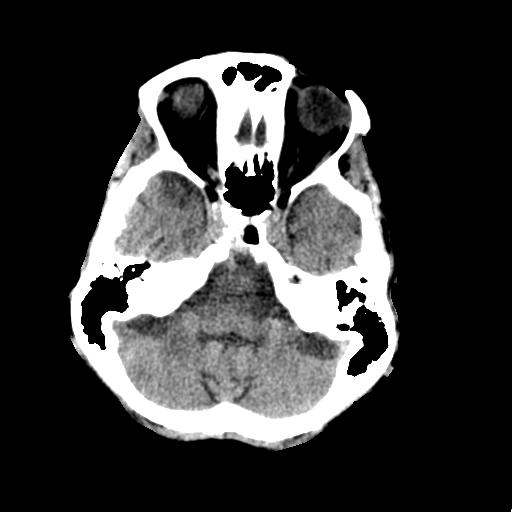
[im 8/29  brain]
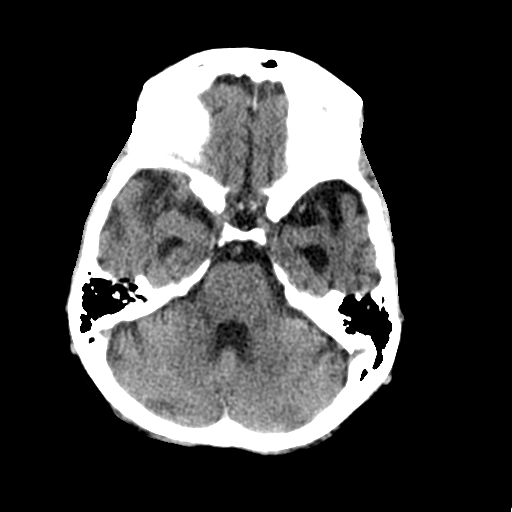
[im 11/29  brain]
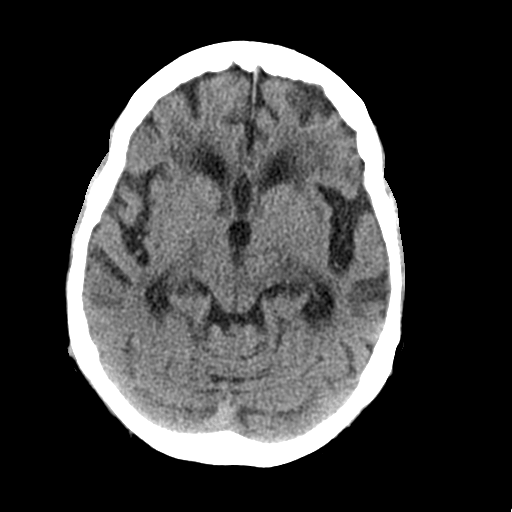
[im 14/29  brain]
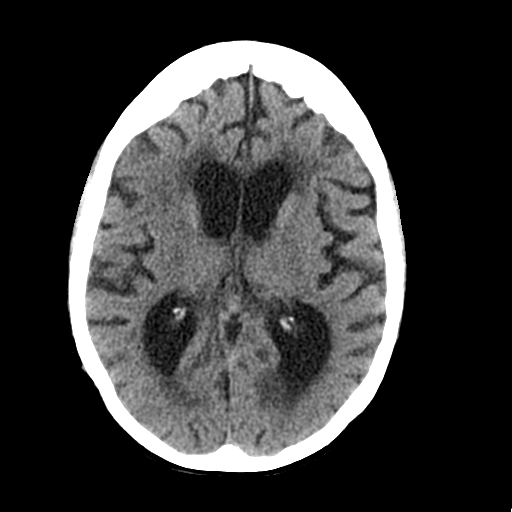
[im 14/29  bone]
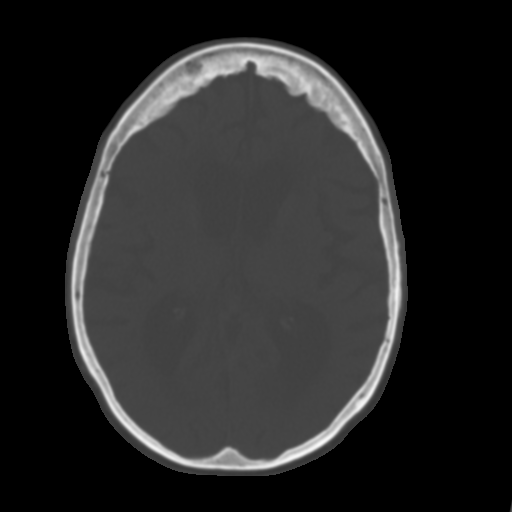
[im 16/29  brain]
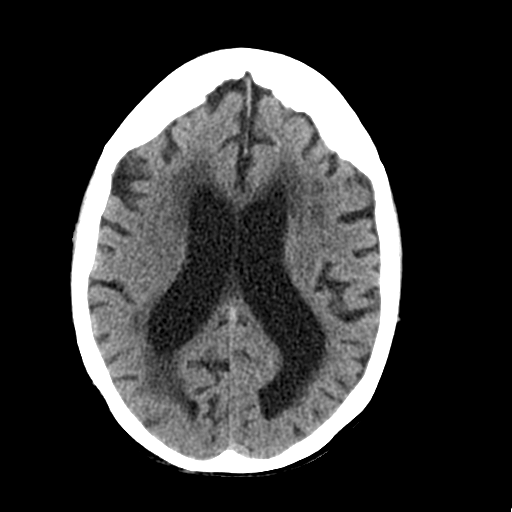
[im 19/29  brain]
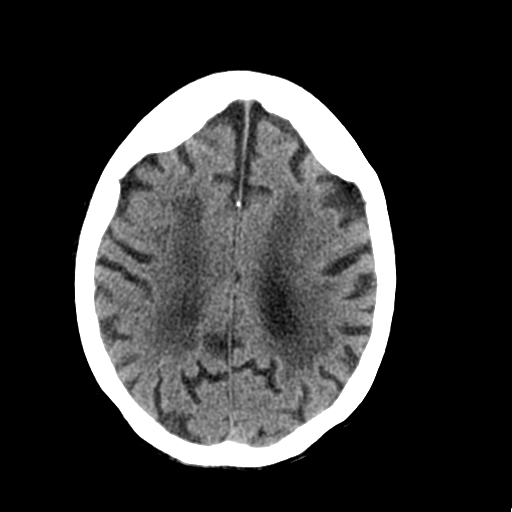
[im 22/29  brain]
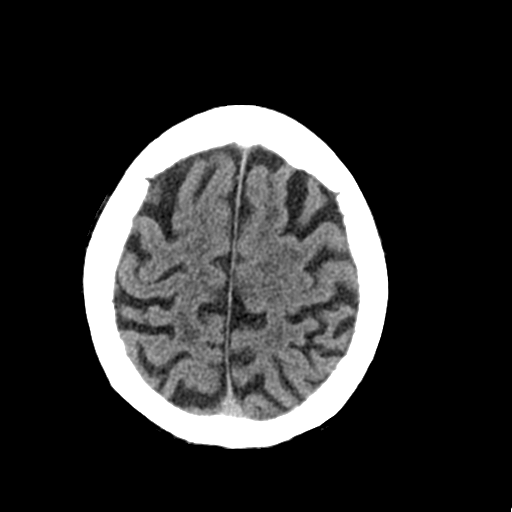
[im 24/29  brain]
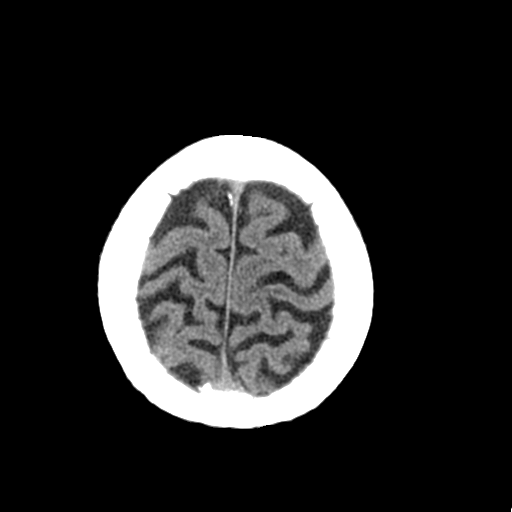
[im 24/29  bone]
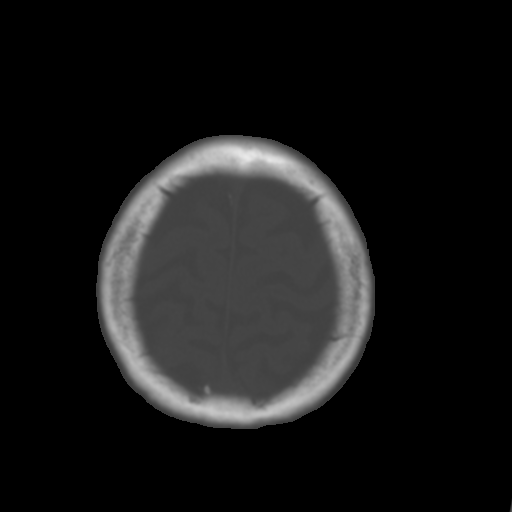
[im 27/29  brain]
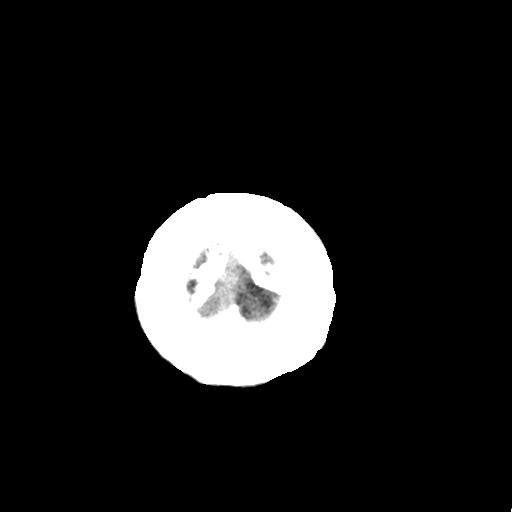

[Series 4: coronal soft tissue · coronal · 0.28mm/px · 3 of 63 slices shown]
[im 21/63  brain]
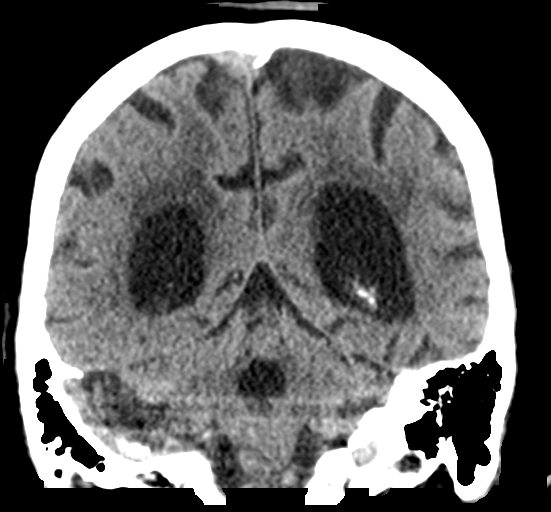
[im 28/63  brain]
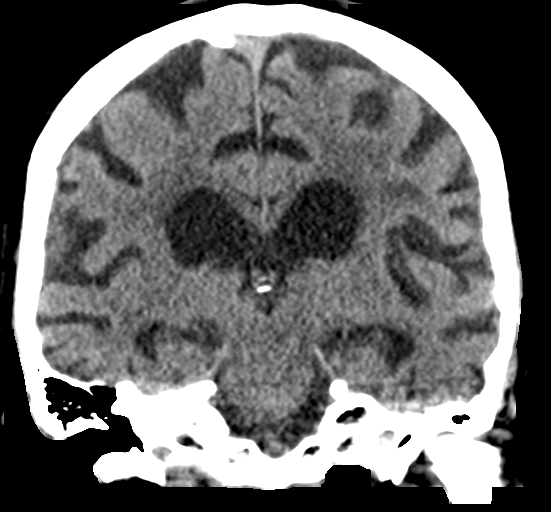
[im 35/63  brain]
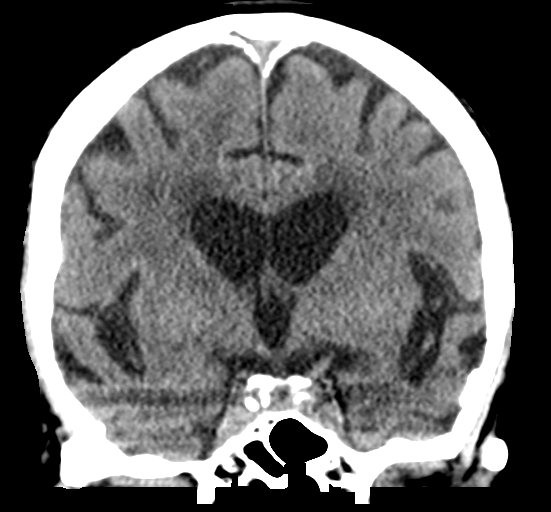

[Series 5: sagittal soft tissue · sagittal · 0.29mm/px · 3 of 49 slices shown]
[im 17/49  brain]
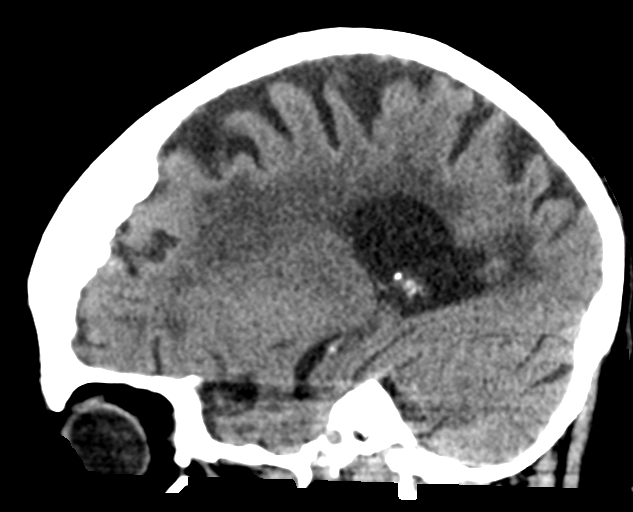
[im 25/49  brain]
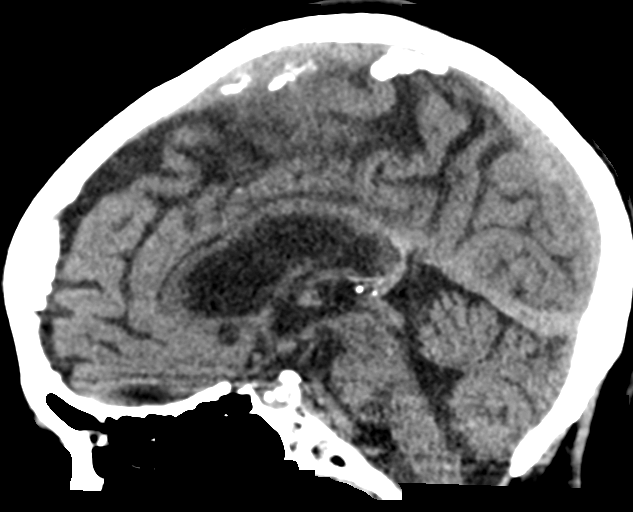
[im 33/49  brain]
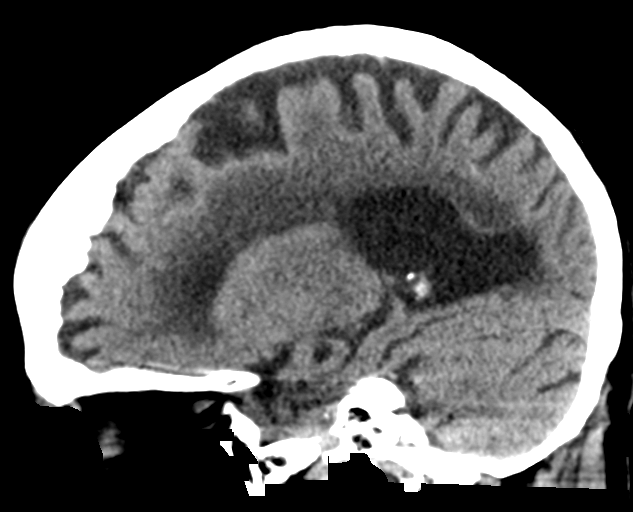

[16 of 46 positions shown; findings below may reference images not displayed]

FINDINGS: Brain: No evidence of acute infarction, hemorrhage, hydrocephalus,
extra-axial collection or mass lesion/mass effect. Confluent chronic
small vessel ischemic gliosis in the cerebral white matter. Cerebral
volume loss that is prominent, especially at the medial temporal
lobes, worse on the left.

Vascular: No hyperdense vessel or unexpected calcification.

Skull: Normal. Negative for fracture or focal lesion.

Sinuses/Orbits: Negative
IMPRESSION: 1. No acute or reversible finding.
2. Prominent brain atrophy and extensive chronic white matter
disease.

## 2021-12-12 DIAGNOSIS — R69 Illness, unspecified: Secondary | ICD-10-CM | POA: Diagnosis not present

## 2021-12-13 DIAGNOSIS — R69 Illness, unspecified: Secondary | ICD-10-CM | POA: Diagnosis not present

## 2021-12-14 DIAGNOSIS — R69 Illness, unspecified: Secondary | ICD-10-CM | POA: Diagnosis not present

## 2021-12-15 DIAGNOSIS — R69 Illness, unspecified: Secondary | ICD-10-CM | POA: Diagnosis not present

## 2021-12-16 DIAGNOSIS — R69 Illness, unspecified: Secondary | ICD-10-CM | POA: Diagnosis not present

## 2021-12-17 DIAGNOSIS — R69 Illness, unspecified: Secondary | ICD-10-CM | POA: Diagnosis not present

## 2021-12-18 DIAGNOSIS — F331 Major depressive disorder, recurrent, moderate: Secondary | ICD-10-CM | POA: Diagnosis not present

## 2021-12-18 DIAGNOSIS — R69 Illness, unspecified: Secondary | ICD-10-CM | POA: Diagnosis not present

## 2021-12-18 DIAGNOSIS — F01B18 Vascular dementia, moderate, with other behavioral disturbance: Secondary | ICD-10-CM | POA: Diagnosis not present

## 2021-12-19 DIAGNOSIS — R69 Illness, unspecified: Secondary | ICD-10-CM | POA: Diagnosis not present

## 2021-12-20 DIAGNOSIS — R69 Illness, unspecified: Secondary | ICD-10-CM | POA: Diagnosis not present

## 2021-12-21 DIAGNOSIS — R69 Illness, unspecified: Secondary | ICD-10-CM | POA: Diagnosis not present

## 2021-12-22 DIAGNOSIS — R69 Illness, unspecified: Secondary | ICD-10-CM | POA: Diagnosis not present

## 2021-12-23 DIAGNOSIS — R69 Illness, unspecified: Secondary | ICD-10-CM | POA: Diagnosis not present

## 2021-12-24 DIAGNOSIS — R69 Illness, unspecified: Secondary | ICD-10-CM | POA: Diagnosis not present

## 2021-12-25 DIAGNOSIS — R69 Illness, unspecified: Secondary | ICD-10-CM | POA: Diagnosis not present

## 2021-12-26 DIAGNOSIS — R69 Illness, unspecified: Secondary | ICD-10-CM | POA: Diagnosis not present

## 2021-12-27 DIAGNOSIS — R69 Illness, unspecified: Secondary | ICD-10-CM | POA: Diagnosis not present

## 2021-12-28 DIAGNOSIS — R69 Illness, unspecified: Secondary | ICD-10-CM | POA: Diagnosis not present

## 2021-12-29 DIAGNOSIS — R69 Illness, unspecified: Secondary | ICD-10-CM | POA: Diagnosis not present

## 2021-12-30 DIAGNOSIS — R69 Illness, unspecified: Secondary | ICD-10-CM | POA: Diagnosis not present

## 2021-12-31 DIAGNOSIS — R69 Illness, unspecified: Secondary | ICD-10-CM | POA: Diagnosis not present

## 2022-01-01 DIAGNOSIS — R69 Illness, unspecified: Secondary | ICD-10-CM | POA: Diagnosis not present

## 2022-01-02 DIAGNOSIS — R69 Illness, unspecified: Secondary | ICD-10-CM | POA: Diagnosis not present

## 2022-01-03 DIAGNOSIS — R69 Illness, unspecified: Secondary | ICD-10-CM | POA: Diagnosis not present

## 2022-01-04 DIAGNOSIS — R69 Illness, unspecified: Secondary | ICD-10-CM | POA: Diagnosis not present

## 2022-01-05 DIAGNOSIS — R69 Illness, unspecified: Secondary | ICD-10-CM | POA: Diagnosis not present

## 2022-01-06 DIAGNOSIS — R69 Illness, unspecified: Secondary | ICD-10-CM | POA: Diagnosis not present

## 2022-01-07 DIAGNOSIS — R69 Illness, unspecified: Secondary | ICD-10-CM | POA: Diagnosis not present

## 2022-01-08 DIAGNOSIS — R69 Illness, unspecified: Secondary | ICD-10-CM | POA: Diagnosis not present

## 2022-01-09 DIAGNOSIS — R69 Illness, unspecified: Secondary | ICD-10-CM | POA: Diagnosis not present

## 2022-01-10 DIAGNOSIS — R69 Illness, unspecified: Secondary | ICD-10-CM | POA: Diagnosis not present

## 2022-01-11 DIAGNOSIS — R69 Illness, unspecified: Secondary | ICD-10-CM | POA: Diagnosis not present

## 2022-01-12 DIAGNOSIS — R2689 Other abnormalities of gait and mobility: Secondary | ICD-10-CM | POA: Diagnosis not present

## 2022-01-12 DIAGNOSIS — K640 First degree hemorrhoids: Secondary | ICD-10-CM | POA: Diagnosis not present

## 2022-01-12 DIAGNOSIS — F0150 Vascular dementia without behavioral disturbance: Secondary | ICD-10-CM | POA: Diagnosis not present

## 2022-01-12 DIAGNOSIS — Z0000 Encounter for general adult medical examination without abnormal findings: Secondary | ICD-10-CM | POA: Diagnosis not present

## 2022-01-12 DIAGNOSIS — Z741 Need for assistance with personal care: Secondary | ICD-10-CM | POA: Diagnosis not present

## 2022-01-12 DIAGNOSIS — R69 Illness, unspecified: Secondary | ICD-10-CM | POA: Diagnosis not present

## 2022-01-13 DIAGNOSIS — F0150 Vascular dementia without behavioral disturbance: Secondary | ICD-10-CM | POA: Diagnosis not present

## 2022-01-13 DIAGNOSIS — R69 Illness, unspecified: Secondary | ICD-10-CM | POA: Diagnosis not present

## 2022-01-14 DIAGNOSIS — F0150 Vascular dementia without behavioral disturbance: Secondary | ICD-10-CM | POA: Diagnosis not present

## 2022-01-14 DIAGNOSIS — R69 Illness, unspecified: Secondary | ICD-10-CM | POA: Diagnosis not present

## 2022-01-15 DIAGNOSIS — F0150 Vascular dementia without behavioral disturbance: Secondary | ICD-10-CM | POA: Diagnosis not present

## 2022-01-15 DIAGNOSIS — R69 Illness, unspecified: Secondary | ICD-10-CM | POA: Diagnosis not present

## 2022-01-16 DIAGNOSIS — F0150 Vascular dementia without behavioral disturbance: Secondary | ICD-10-CM | POA: Diagnosis not present

## 2022-01-16 DIAGNOSIS — R69 Illness, unspecified: Secondary | ICD-10-CM | POA: Diagnosis not present

## 2022-01-17 DIAGNOSIS — R69 Illness, unspecified: Secondary | ICD-10-CM | POA: Diagnosis not present

## 2022-01-17 DIAGNOSIS — F0150 Vascular dementia without behavioral disturbance: Secondary | ICD-10-CM | POA: Diagnosis not present

## 2022-01-18 DIAGNOSIS — F0150 Vascular dementia without behavioral disturbance: Secondary | ICD-10-CM | POA: Diagnosis not present

## 2022-01-18 DIAGNOSIS — R69 Illness, unspecified: Secondary | ICD-10-CM | POA: Diagnosis not present

## 2022-01-19 DIAGNOSIS — R69 Illness, unspecified: Secondary | ICD-10-CM | POA: Diagnosis not present

## 2022-01-19 DIAGNOSIS — F0150 Vascular dementia without behavioral disturbance: Secondary | ICD-10-CM | POA: Diagnosis not present

## 2022-01-20 DIAGNOSIS — R69 Illness, unspecified: Secondary | ICD-10-CM | POA: Diagnosis not present

## 2022-01-20 DIAGNOSIS — F0150 Vascular dementia without behavioral disturbance: Secondary | ICD-10-CM | POA: Diagnosis not present

## 2022-01-21 DIAGNOSIS — F0150 Vascular dementia without behavioral disturbance: Secondary | ICD-10-CM | POA: Diagnosis not present

## 2022-01-21 DIAGNOSIS — Z79899 Other long term (current) drug therapy: Secondary | ICD-10-CM | POA: Diagnosis not present

## 2022-01-21 DIAGNOSIS — R69 Illness, unspecified: Secondary | ICD-10-CM | POA: Diagnosis not present

## 2022-01-21 DIAGNOSIS — D631 Anemia in chronic kidney disease: Secondary | ICD-10-CM | POA: Diagnosis not present

## 2022-01-21 DIAGNOSIS — Z5181 Encounter for therapeutic drug level monitoring: Secondary | ICD-10-CM | POA: Diagnosis not present

## 2022-01-22 DIAGNOSIS — F0150 Vascular dementia without behavioral disturbance: Secondary | ICD-10-CM | POA: Diagnosis not present

## 2022-01-22 DIAGNOSIS — R69 Illness, unspecified: Secondary | ICD-10-CM | POA: Diagnosis not present

## 2022-01-23 DIAGNOSIS — R69 Illness, unspecified: Secondary | ICD-10-CM | POA: Diagnosis not present

## 2022-01-23 DIAGNOSIS — F0150 Vascular dementia without behavioral disturbance: Secondary | ICD-10-CM | POA: Diagnosis not present

## 2022-01-24 DIAGNOSIS — R69 Illness, unspecified: Secondary | ICD-10-CM | POA: Diagnosis not present

## 2022-01-24 DIAGNOSIS — F0150 Vascular dementia without behavioral disturbance: Secondary | ICD-10-CM | POA: Diagnosis not present

## 2022-01-25 DIAGNOSIS — F0150 Vascular dementia without behavioral disturbance: Secondary | ICD-10-CM | POA: Diagnosis not present

## 2022-01-25 DIAGNOSIS — R69 Illness, unspecified: Secondary | ICD-10-CM | POA: Diagnosis not present

## 2022-01-26 DIAGNOSIS — R69 Illness, unspecified: Secondary | ICD-10-CM | POA: Diagnosis not present

## 2022-01-27 DIAGNOSIS — R69 Illness, unspecified: Secondary | ICD-10-CM | POA: Diagnosis not present

## 2022-01-28 DIAGNOSIS — R69 Illness, unspecified: Secondary | ICD-10-CM | POA: Diagnosis not present

## 2022-01-29 DIAGNOSIS — F331 Major depressive disorder, recurrent, moderate: Secondary | ICD-10-CM | POA: Diagnosis not present

## 2022-01-29 DIAGNOSIS — R69 Illness, unspecified: Secondary | ICD-10-CM | POA: Diagnosis not present

## 2022-01-29 DIAGNOSIS — F01B18 Vascular dementia, moderate, with other behavioral disturbance: Secondary | ICD-10-CM | POA: Diagnosis not present

## 2022-01-30 DIAGNOSIS — R69 Illness, unspecified: Secondary | ICD-10-CM | POA: Diagnosis not present

## 2022-01-31 DIAGNOSIS — R69 Illness, unspecified: Secondary | ICD-10-CM | POA: Diagnosis not present

## 2022-02-01 DIAGNOSIS — R69 Illness, unspecified: Secondary | ICD-10-CM | POA: Diagnosis not present

## 2022-02-02 DIAGNOSIS — R69 Illness, unspecified: Secondary | ICD-10-CM | POA: Diagnosis not present

## 2022-02-03 DIAGNOSIS — R69 Illness, unspecified: Secondary | ICD-10-CM | POA: Diagnosis not present

## 2022-02-04 DIAGNOSIS — R69 Illness, unspecified: Secondary | ICD-10-CM | POA: Diagnosis not present

## 2022-02-05 DIAGNOSIS — R69 Illness, unspecified: Secondary | ICD-10-CM | POA: Diagnosis not present

## 2022-02-05 DIAGNOSIS — H2513 Age-related nuclear cataract, bilateral: Secondary | ICD-10-CM | POA: Diagnosis not present

## 2022-02-05 DIAGNOSIS — H25013 Cortical age-related cataract, bilateral: Secondary | ICD-10-CM | POA: Diagnosis not present

## 2022-02-06 DIAGNOSIS — R69 Illness, unspecified: Secondary | ICD-10-CM | POA: Diagnosis not present

## 2022-02-07 DIAGNOSIS — R69 Illness, unspecified: Secondary | ICD-10-CM | POA: Diagnosis not present

## 2022-02-08 DIAGNOSIS — R69 Illness, unspecified: Secondary | ICD-10-CM | POA: Diagnosis not present

## 2022-02-09 DIAGNOSIS — R69 Illness, unspecified: Secondary | ICD-10-CM | POA: Diagnosis not present

## 2022-02-10 DIAGNOSIS — R69 Illness, unspecified: Secondary | ICD-10-CM | POA: Diagnosis not present

## 2022-02-11 DIAGNOSIS — R69 Illness, unspecified: Secondary | ICD-10-CM | POA: Diagnosis not present

## 2022-02-12 DIAGNOSIS — R69 Illness, unspecified: Secondary | ICD-10-CM | POA: Diagnosis not present

## 2022-02-13 DIAGNOSIS — R69 Illness, unspecified: Secondary | ICD-10-CM | POA: Diagnosis not present

## 2022-02-14 DIAGNOSIS — R69 Illness, unspecified: Secondary | ICD-10-CM | POA: Diagnosis not present

## 2022-02-15 DIAGNOSIS — R69 Illness, unspecified: Secondary | ICD-10-CM | POA: Diagnosis not present

## 2022-02-16 DIAGNOSIS — R69 Illness, unspecified: Secondary | ICD-10-CM | POA: Diagnosis not present

## 2022-02-17 DIAGNOSIS — R69 Illness, unspecified: Secondary | ICD-10-CM | POA: Diagnosis not present

## 2022-02-18 DIAGNOSIS — R69 Illness, unspecified: Secondary | ICD-10-CM | POA: Diagnosis not present

## 2022-02-19 DIAGNOSIS — R69 Illness, unspecified: Secondary | ICD-10-CM | POA: Diagnosis not present

## 2022-02-20 DIAGNOSIS — R69 Illness, unspecified: Secondary | ICD-10-CM | POA: Diagnosis not present

## 2022-02-21 DIAGNOSIS — R69 Illness, unspecified: Secondary | ICD-10-CM | POA: Diagnosis not present

## 2022-02-22 DIAGNOSIS — R69 Illness, unspecified: Secondary | ICD-10-CM | POA: Diagnosis not present

## 2022-02-23 DIAGNOSIS — R69 Illness, unspecified: Secondary | ICD-10-CM | POA: Diagnosis not present

## 2022-02-24 DIAGNOSIS — R69 Illness, unspecified: Secondary | ICD-10-CM | POA: Diagnosis not present

## 2022-02-25 DIAGNOSIS — R69 Illness, unspecified: Secondary | ICD-10-CM | POA: Diagnosis not present

## 2022-02-26 DIAGNOSIS — R69 Illness, unspecified: Secondary | ICD-10-CM | POA: Diagnosis not present

## 2022-02-27 DIAGNOSIS — R69 Illness, unspecified: Secondary | ICD-10-CM | POA: Diagnosis not present

## 2022-02-28 DIAGNOSIS — R69 Illness, unspecified: Secondary | ICD-10-CM | POA: Diagnosis not present

## 2022-03-01 DIAGNOSIS — R69 Illness, unspecified: Secondary | ICD-10-CM | POA: Diagnosis not present

## 2022-03-02 DIAGNOSIS — R69 Illness, unspecified: Secondary | ICD-10-CM | POA: Diagnosis not present

## 2022-03-03 DIAGNOSIS — R69 Illness, unspecified: Secondary | ICD-10-CM | POA: Diagnosis not present

## 2022-03-04 DIAGNOSIS — R69 Illness, unspecified: Secondary | ICD-10-CM | POA: Diagnosis not present

## 2022-03-05 DIAGNOSIS — R69 Illness, unspecified: Secondary | ICD-10-CM | POA: Diagnosis not present

## 2022-03-06 DIAGNOSIS — R69 Illness, unspecified: Secondary | ICD-10-CM | POA: Diagnosis not present

## 2022-03-07 DIAGNOSIS — R69 Illness, unspecified: Secondary | ICD-10-CM | POA: Diagnosis not present

## 2022-03-08 DIAGNOSIS — R69 Illness, unspecified: Secondary | ICD-10-CM | POA: Diagnosis not present

## 2022-03-09 DIAGNOSIS — R69 Illness, unspecified: Secondary | ICD-10-CM | POA: Diagnosis not present

## 2022-03-10 DIAGNOSIS — R69 Illness, unspecified: Secondary | ICD-10-CM | POA: Diagnosis not present

## 2022-03-11 DIAGNOSIS — R69 Illness, unspecified: Secondary | ICD-10-CM | POA: Diagnosis not present

## 2022-03-12 DIAGNOSIS — R69 Illness, unspecified: Secondary | ICD-10-CM | POA: Diagnosis not present

## 2022-03-13 DIAGNOSIS — R69 Illness, unspecified: Secondary | ICD-10-CM | POA: Diagnosis not present

## 2022-03-14 DIAGNOSIS — R69 Illness, unspecified: Secondary | ICD-10-CM | POA: Diagnosis not present

## 2022-03-15 DIAGNOSIS — R69 Illness, unspecified: Secondary | ICD-10-CM | POA: Diagnosis not present

## 2022-03-16 DIAGNOSIS — R69 Illness, unspecified: Secondary | ICD-10-CM | POA: Diagnosis not present

## 2022-03-17 DIAGNOSIS — R69 Illness, unspecified: Secondary | ICD-10-CM | POA: Diagnosis not present

## 2022-03-18 DIAGNOSIS — R69 Illness, unspecified: Secondary | ICD-10-CM | POA: Diagnosis not present

## 2022-03-19 DIAGNOSIS — R69 Illness, unspecified: Secondary | ICD-10-CM | POA: Diagnosis not present

## 2022-03-20 DIAGNOSIS — R69 Illness, unspecified: Secondary | ICD-10-CM | POA: Diagnosis not present

## 2022-03-21 DIAGNOSIS — R69 Illness, unspecified: Secondary | ICD-10-CM | POA: Diagnosis not present

## 2022-03-22 DIAGNOSIS — R69 Illness, unspecified: Secondary | ICD-10-CM | POA: Diagnosis not present

## 2022-03-23 DIAGNOSIS — R69 Illness, unspecified: Secondary | ICD-10-CM | POA: Diagnosis not present

## 2022-03-23 DIAGNOSIS — R1311 Dysphagia, oral phase: Secondary | ICD-10-CM | POA: Diagnosis not present

## 2022-03-24 DIAGNOSIS — R69 Illness, unspecified: Secondary | ICD-10-CM | POA: Diagnosis not present

## 2022-03-25 DIAGNOSIS — R69 Illness, unspecified: Secondary | ICD-10-CM | POA: Diagnosis not present

## 2022-03-26 DIAGNOSIS — F01B18 Vascular dementia, moderate, with other behavioral disturbance: Secondary | ICD-10-CM | POA: Diagnosis not present

## 2022-03-26 DIAGNOSIS — F331 Major depressive disorder, recurrent, moderate: Secondary | ICD-10-CM | POA: Diagnosis not present

## 2022-03-26 DIAGNOSIS — R69 Illness, unspecified: Secondary | ICD-10-CM | POA: Diagnosis not present

## 2022-03-27 DIAGNOSIS — R69 Illness, unspecified: Secondary | ICD-10-CM | POA: Diagnosis not present

## 2022-03-28 DIAGNOSIS — R69 Illness, unspecified: Secondary | ICD-10-CM | POA: Diagnosis not present

## 2022-03-29 DIAGNOSIS — R69 Illness, unspecified: Secondary | ICD-10-CM | POA: Diagnosis not present

## 2022-03-30 DIAGNOSIS — R69 Illness, unspecified: Secondary | ICD-10-CM | POA: Diagnosis not present

## 2022-03-31 DIAGNOSIS — R69 Illness, unspecified: Secondary | ICD-10-CM | POA: Diagnosis not present

## 2022-04-01 DIAGNOSIS — R69 Illness, unspecified: Secondary | ICD-10-CM | POA: Diagnosis not present

## 2022-04-02 DIAGNOSIS — R69 Illness, unspecified: Secondary | ICD-10-CM | POA: Diagnosis not present

## 2022-04-03 DIAGNOSIS — R69 Illness, unspecified: Secondary | ICD-10-CM | POA: Diagnosis not present

## 2022-04-04 DIAGNOSIS — R69 Illness, unspecified: Secondary | ICD-10-CM | POA: Diagnosis not present

## 2022-04-05 DIAGNOSIS — R69 Illness, unspecified: Secondary | ICD-10-CM | POA: Diagnosis not present

## 2022-04-06 DIAGNOSIS — R69 Illness, unspecified: Secondary | ICD-10-CM | POA: Diagnosis not present

## 2022-04-06 DIAGNOSIS — R4182 Altered mental status, unspecified: Secondary | ICD-10-CM | POA: Diagnosis not present

## 2022-04-07 DIAGNOSIS — R69 Illness, unspecified: Secondary | ICD-10-CM | POA: Diagnosis not present

## 2022-04-08 DIAGNOSIS — R69 Illness, unspecified: Secondary | ICD-10-CM | POA: Diagnosis not present

## 2022-04-09 DIAGNOSIS — R69 Illness, unspecified: Secondary | ICD-10-CM | POA: Diagnosis not present

## 2022-04-10 DIAGNOSIS — R69 Illness, unspecified: Secondary | ICD-10-CM | POA: Diagnosis not present

## 2022-04-11 DIAGNOSIS — R69 Illness, unspecified: Secondary | ICD-10-CM | POA: Diagnosis not present

## 2022-04-12 DIAGNOSIS — R69 Illness, unspecified: Secondary | ICD-10-CM | POA: Diagnosis not present

## 2022-04-13 DIAGNOSIS — R69 Illness, unspecified: Secondary | ICD-10-CM | POA: Diagnosis not present

## 2022-04-14 DIAGNOSIS — R69 Illness, unspecified: Secondary | ICD-10-CM | POA: Diagnosis not present

## 2022-04-15 DIAGNOSIS — R69 Illness, unspecified: Secondary | ICD-10-CM | POA: Diagnosis not present

## 2022-04-16 DIAGNOSIS — R69 Illness, unspecified: Secondary | ICD-10-CM | POA: Diagnosis not present

## 2022-04-17 DIAGNOSIS — R69 Illness, unspecified: Secondary | ICD-10-CM | POA: Diagnosis not present

## 2022-04-18 DIAGNOSIS — R69 Illness, unspecified: Secondary | ICD-10-CM | POA: Diagnosis not present

## 2022-04-19 DIAGNOSIS — R69 Illness, unspecified: Secondary | ICD-10-CM | POA: Diagnosis not present

## 2022-04-20 DIAGNOSIS — R69 Illness, unspecified: Secondary | ICD-10-CM | POA: Diagnosis not present

## 2022-04-21 DIAGNOSIS — R69 Illness, unspecified: Secondary | ICD-10-CM | POA: Diagnosis not present

## 2022-04-22 DIAGNOSIS — R69 Illness, unspecified: Secondary | ICD-10-CM | POA: Diagnosis not present

## 2022-04-23 DIAGNOSIS — F01B18 Vascular dementia, moderate, with other behavioral disturbance: Secondary | ICD-10-CM | POA: Diagnosis not present

## 2022-04-23 DIAGNOSIS — R69 Illness, unspecified: Secondary | ICD-10-CM | POA: Diagnosis not present

## 2022-04-23 DIAGNOSIS — F331 Major depressive disorder, recurrent, moderate: Secondary | ICD-10-CM | POA: Diagnosis not present

## 2022-04-24 DIAGNOSIS — R69 Illness, unspecified: Secondary | ICD-10-CM | POA: Diagnosis not present

## 2022-04-25 DIAGNOSIS — R69 Illness, unspecified: Secondary | ICD-10-CM | POA: Diagnosis not present

## 2022-04-26 DIAGNOSIS — R69 Illness, unspecified: Secondary | ICD-10-CM | POA: Diagnosis not present

## 2022-04-28 DIAGNOSIS — R69 Illness, unspecified: Secondary | ICD-10-CM | POA: Diagnosis not present

## 2022-04-28 DIAGNOSIS — N3946 Mixed incontinence: Secondary | ICD-10-CM | POA: Diagnosis not present

## 2022-05-14 DIAGNOSIS — F331 Major depressive disorder, recurrent, moderate: Secondary | ICD-10-CM | POA: Diagnosis not present

## 2022-05-14 DIAGNOSIS — R69 Illness, unspecified: Secondary | ICD-10-CM | POA: Diagnosis not present

## 2022-05-14 DIAGNOSIS — F01B18 Vascular dementia, moderate, with other behavioral disturbance: Secondary | ICD-10-CM | POA: Diagnosis not present

## 2022-06-03 DIAGNOSIS — R300 Dysuria: Secondary | ICD-10-CM | POA: Diagnosis not present

## 2022-06-11 DIAGNOSIS — F331 Major depressive disorder, recurrent, moderate: Secondary | ICD-10-CM | POA: Diagnosis not present

## 2022-06-11 DIAGNOSIS — R69 Illness, unspecified: Secondary | ICD-10-CM | POA: Diagnosis not present

## 2022-06-11 DIAGNOSIS — F01B18 Vascular dementia, moderate, with other behavioral disturbance: Secondary | ICD-10-CM | POA: Diagnosis not present

## 2022-06-15 DIAGNOSIS — R5383 Other fatigue: Secondary | ICD-10-CM | POA: Diagnosis not present

## 2022-06-15 DIAGNOSIS — Z79899 Other long term (current) drug therapy: Secondary | ICD-10-CM | POA: Diagnosis not present

## 2022-06-15 DIAGNOSIS — R69 Illness, unspecified: Secondary | ICD-10-CM | POA: Diagnosis not present

## 2022-06-17 DIAGNOSIS — Z79899 Other long term (current) drug therapy: Secondary | ICD-10-CM | POA: Diagnosis not present

## 2022-06-17 DIAGNOSIS — Z5181 Encounter for therapeutic drug level monitoring: Secondary | ICD-10-CM | POA: Diagnosis not present

## 2022-06-17 DIAGNOSIS — I10 Essential (primary) hypertension: Secondary | ICD-10-CM | POA: Diagnosis not present

## 2022-06-22 DIAGNOSIS — R69 Illness, unspecified: Secondary | ICD-10-CM | POA: Diagnosis not present

## 2022-06-23 DIAGNOSIS — R69 Illness, unspecified: Secondary | ICD-10-CM | POA: Diagnosis not present

## 2022-06-24 DIAGNOSIS — R69 Illness, unspecified: Secondary | ICD-10-CM | POA: Diagnosis not present

## 2022-06-25 DIAGNOSIS — Z79899 Other long term (current) drug therapy: Secondary | ICD-10-CM | POA: Diagnosis not present

## 2022-06-25 DIAGNOSIS — I10 Essential (primary) hypertension: Secondary | ICD-10-CM | POA: Diagnosis not present

## 2022-06-25 DIAGNOSIS — R69 Illness, unspecified: Secondary | ICD-10-CM | POA: Diagnosis not present

## 2022-06-26 DIAGNOSIS — R69 Illness, unspecified: Secondary | ICD-10-CM | POA: Diagnosis not present

## 2022-06-27 DIAGNOSIS — R69 Illness, unspecified: Secondary | ICD-10-CM | POA: Diagnosis not present

## 2022-06-28 DIAGNOSIS — R69 Illness, unspecified: Secondary | ICD-10-CM | POA: Diagnosis not present

## 2022-06-29 DIAGNOSIS — R69 Illness, unspecified: Secondary | ICD-10-CM | POA: Diagnosis not present

## 2022-06-30 DIAGNOSIS — R69 Illness, unspecified: Secondary | ICD-10-CM | POA: Diagnosis not present

## 2022-07-01 DIAGNOSIS — R69 Illness, unspecified: Secondary | ICD-10-CM | POA: Diagnosis not present

## 2022-07-02 DIAGNOSIS — R69 Illness, unspecified: Secondary | ICD-10-CM | POA: Diagnosis not present

## 2022-07-03 DIAGNOSIS — R69 Illness, unspecified: Secondary | ICD-10-CM | POA: Diagnosis not present

## 2022-07-04 DIAGNOSIS — R69 Illness, unspecified: Secondary | ICD-10-CM | POA: Diagnosis not present

## 2022-07-05 DIAGNOSIS — R69 Illness, unspecified: Secondary | ICD-10-CM | POA: Diagnosis not present

## 2022-07-06 DIAGNOSIS — R69 Illness, unspecified: Secondary | ICD-10-CM | POA: Diagnosis not present

## 2022-07-07 DIAGNOSIS — R69 Illness, unspecified: Secondary | ICD-10-CM | POA: Diagnosis not present

## 2022-07-08 DIAGNOSIS — R69 Illness, unspecified: Secondary | ICD-10-CM | POA: Diagnosis not present

## 2022-07-09 DIAGNOSIS — R69 Illness, unspecified: Secondary | ICD-10-CM | POA: Diagnosis not present

## 2022-07-10 DIAGNOSIS — R69 Illness, unspecified: Secondary | ICD-10-CM | POA: Diagnosis not present

## 2022-07-11 DIAGNOSIS — R69 Illness, unspecified: Secondary | ICD-10-CM | POA: Diagnosis not present

## 2022-07-12 DIAGNOSIS — R69 Illness, unspecified: Secondary | ICD-10-CM | POA: Diagnosis not present

## 2022-07-13 DIAGNOSIS — R69 Illness, unspecified: Secondary | ICD-10-CM | POA: Diagnosis not present

## 2022-07-14 DIAGNOSIS — R69 Illness, unspecified: Secondary | ICD-10-CM | POA: Diagnosis not present

## 2022-07-15 DIAGNOSIS — R69 Illness, unspecified: Secondary | ICD-10-CM | POA: Diagnosis not present

## 2022-07-16 DIAGNOSIS — F01B18 Vascular dementia, moderate, with other behavioral disturbance: Secondary | ICD-10-CM | POA: Diagnosis not present

## 2022-07-16 DIAGNOSIS — F331 Major depressive disorder, recurrent, moderate: Secondary | ICD-10-CM | POA: Diagnosis not present

## 2022-07-16 DIAGNOSIS — G301 Alzheimer's disease with late onset: Secondary | ICD-10-CM | POA: Diagnosis not present

## 2022-07-16 DIAGNOSIS — R69 Illness, unspecified: Secondary | ICD-10-CM | POA: Diagnosis not present

## 2022-07-17 DIAGNOSIS — R69 Illness, unspecified: Secondary | ICD-10-CM | POA: Diagnosis not present

## 2022-07-18 DIAGNOSIS — R69 Illness, unspecified: Secondary | ICD-10-CM | POA: Diagnosis not present

## 2022-07-19 DIAGNOSIS — R69 Illness, unspecified: Secondary | ICD-10-CM | POA: Diagnosis not present

## 2022-07-20 DIAGNOSIS — R69 Illness, unspecified: Secondary | ICD-10-CM | POA: Diagnosis not present

## 2022-07-21 DIAGNOSIS — R69 Illness, unspecified: Secondary | ICD-10-CM | POA: Diagnosis not present

## 2022-07-22 DIAGNOSIS — R69 Illness, unspecified: Secondary | ICD-10-CM | POA: Diagnosis not present

## 2022-07-23 DIAGNOSIS — R69 Illness, unspecified: Secondary | ICD-10-CM | POA: Diagnosis not present

## 2022-07-24 DIAGNOSIS — R69 Illness, unspecified: Secondary | ICD-10-CM | POA: Diagnosis not present

## 2022-07-25 DIAGNOSIS — R69 Illness, unspecified: Secondary | ICD-10-CM | POA: Diagnosis not present

## 2022-07-26 DIAGNOSIS — R69 Illness, unspecified: Secondary | ICD-10-CM | POA: Diagnosis not present

## 2022-07-27 DIAGNOSIS — R69 Illness, unspecified: Secondary | ICD-10-CM | POA: Diagnosis not present

## 2022-07-28 DIAGNOSIS — R69 Illness, unspecified: Secondary | ICD-10-CM | POA: Diagnosis not present

## 2022-07-29 DIAGNOSIS — R69 Illness, unspecified: Secondary | ICD-10-CM | POA: Diagnosis not present

## 2022-07-30 DIAGNOSIS — R69 Illness, unspecified: Secondary | ICD-10-CM | POA: Diagnosis not present

## 2022-07-31 DIAGNOSIS — R69 Illness, unspecified: Secondary | ICD-10-CM | POA: Diagnosis not present

## 2022-08-01 DIAGNOSIS — R69 Illness, unspecified: Secondary | ICD-10-CM | POA: Diagnosis not present

## 2022-08-02 DIAGNOSIS — R69 Illness, unspecified: Secondary | ICD-10-CM | POA: Diagnosis not present

## 2022-08-03 DIAGNOSIS — R69 Illness, unspecified: Secondary | ICD-10-CM | POA: Diagnosis not present

## 2022-08-04 DIAGNOSIS — R69 Illness, unspecified: Secondary | ICD-10-CM | POA: Diagnosis not present

## 2022-08-05 DIAGNOSIS — R69 Illness, unspecified: Secondary | ICD-10-CM | POA: Diagnosis not present

## 2022-08-06 DIAGNOSIS — R69 Illness, unspecified: Secondary | ICD-10-CM | POA: Diagnosis not present

## 2022-08-07 DIAGNOSIS — R69 Illness, unspecified: Secondary | ICD-10-CM | POA: Diagnosis not present

## 2022-08-08 DIAGNOSIS — R69 Illness, unspecified: Secondary | ICD-10-CM | POA: Diagnosis not present

## 2022-08-09 DIAGNOSIS — R69 Illness, unspecified: Secondary | ICD-10-CM | POA: Diagnosis not present

## 2022-08-10 DIAGNOSIS — R69 Illness, unspecified: Secondary | ICD-10-CM | POA: Diagnosis not present

## 2022-08-11 DIAGNOSIS — R69 Illness, unspecified: Secondary | ICD-10-CM | POA: Diagnosis not present

## 2022-08-12 DIAGNOSIS — R69 Illness, unspecified: Secondary | ICD-10-CM | POA: Diagnosis not present

## 2022-08-13 DIAGNOSIS — F01B18 Vascular dementia, moderate, with other behavioral disturbance: Secondary | ICD-10-CM | POA: Diagnosis not present

## 2022-08-13 DIAGNOSIS — G301 Alzheimer's disease with late onset: Secondary | ICD-10-CM | POA: Diagnosis not present

## 2022-08-13 DIAGNOSIS — R69 Illness, unspecified: Secondary | ICD-10-CM | POA: Diagnosis not present

## 2022-08-13 DIAGNOSIS — F331 Major depressive disorder, recurrent, moderate: Secondary | ICD-10-CM | POA: Diagnosis not present

## 2022-08-14 DIAGNOSIS — R69 Illness, unspecified: Secondary | ICD-10-CM | POA: Diagnosis not present

## 2022-08-15 DIAGNOSIS — R69 Illness, unspecified: Secondary | ICD-10-CM | POA: Diagnosis not present

## 2022-08-16 DIAGNOSIS — R69 Illness, unspecified: Secondary | ICD-10-CM | POA: Diagnosis not present

## 2022-08-17 DIAGNOSIS — R69 Illness, unspecified: Secondary | ICD-10-CM | POA: Diagnosis not present

## 2022-08-18 DIAGNOSIS — R69 Illness, unspecified: Secondary | ICD-10-CM | POA: Diagnosis not present

## 2022-08-19 DIAGNOSIS — R69 Illness, unspecified: Secondary | ICD-10-CM | POA: Diagnosis not present

## 2022-08-20 DIAGNOSIS — R69 Illness, unspecified: Secondary | ICD-10-CM | POA: Diagnosis not present

## 2022-08-21 DIAGNOSIS — R69 Illness, unspecified: Secondary | ICD-10-CM | POA: Diagnosis not present

## 2022-08-22 DIAGNOSIS — R69 Illness, unspecified: Secondary | ICD-10-CM | POA: Diagnosis not present

## 2022-08-23 DIAGNOSIS — R69 Illness, unspecified: Secondary | ICD-10-CM | POA: Diagnosis not present

## 2022-08-24 DIAGNOSIS — R69 Illness, unspecified: Secondary | ICD-10-CM | POA: Diagnosis not present

## 2022-08-25 DIAGNOSIS — R69 Illness, unspecified: Secondary | ICD-10-CM | POA: Diagnosis not present

## 2022-08-26 DIAGNOSIS — R69 Illness, unspecified: Secondary | ICD-10-CM | POA: Diagnosis not present

## 2022-08-27 DIAGNOSIS — R69 Illness, unspecified: Secondary | ICD-10-CM | POA: Diagnosis not present

## 2022-08-28 DIAGNOSIS — R69 Illness, unspecified: Secondary | ICD-10-CM | POA: Diagnosis not present

## 2022-08-29 DIAGNOSIS — R69 Illness, unspecified: Secondary | ICD-10-CM | POA: Diagnosis not present

## 2022-08-30 DIAGNOSIS — R69 Illness, unspecified: Secondary | ICD-10-CM | POA: Diagnosis not present

## 2022-08-31 DIAGNOSIS — R69 Illness, unspecified: Secondary | ICD-10-CM | POA: Diagnosis not present

## 2022-09-01 DIAGNOSIS — R69 Illness, unspecified: Secondary | ICD-10-CM | POA: Diagnosis not present

## 2022-09-02 DIAGNOSIS — R69 Illness, unspecified: Secondary | ICD-10-CM | POA: Diagnosis not present

## 2022-09-03 DIAGNOSIS — F01B18 Vascular dementia, moderate, with other behavioral disturbance: Secondary | ICD-10-CM | POA: Diagnosis not present

## 2022-09-03 DIAGNOSIS — R69 Illness, unspecified: Secondary | ICD-10-CM | POA: Diagnosis not present

## 2022-09-03 DIAGNOSIS — G301 Alzheimer's disease with late onset: Secondary | ICD-10-CM | POA: Diagnosis not present

## 2022-09-03 DIAGNOSIS — F331 Major depressive disorder, recurrent, moderate: Secondary | ICD-10-CM | POA: Diagnosis not present

## 2022-09-04 DIAGNOSIS — R69 Illness, unspecified: Secondary | ICD-10-CM | POA: Diagnosis not present

## 2022-09-05 DIAGNOSIS — R69 Illness, unspecified: Secondary | ICD-10-CM | POA: Diagnosis not present

## 2022-09-06 DIAGNOSIS — R69 Illness, unspecified: Secondary | ICD-10-CM | POA: Diagnosis not present

## 2022-09-07 DIAGNOSIS — R69 Illness, unspecified: Secondary | ICD-10-CM | POA: Diagnosis not present

## 2022-09-08 DIAGNOSIS — R69 Illness, unspecified: Secondary | ICD-10-CM | POA: Diagnosis not present

## 2022-09-09 DIAGNOSIS — R69 Illness, unspecified: Secondary | ICD-10-CM | POA: Diagnosis not present

## 2022-09-10 DIAGNOSIS — R69 Illness, unspecified: Secondary | ICD-10-CM | POA: Diagnosis not present

## 2022-09-11 DIAGNOSIS — R69 Illness, unspecified: Secondary | ICD-10-CM | POA: Diagnosis not present

## 2022-09-12 DIAGNOSIS — R69 Illness, unspecified: Secondary | ICD-10-CM | POA: Diagnosis not present

## 2022-09-13 DIAGNOSIS — R69 Illness, unspecified: Secondary | ICD-10-CM | POA: Diagnosis not present

## 2022-09-14 DIAGNOSIS — R69 Illness, unspecified: Secondary | ICD-10-CM | POA: Diagnosis not present

## 2022-09-15 DIAGNOSIS — R69 Illness, unspecified: Secondary | ICD-10-CM | POA: Diagnosis not present

## 2022-09-16 DIAGNOSIS — R69 Illness, unspecified: Secondary | ICD-10-CM | POA: Diagnosis not present

## 2022-09-17 DIAGNOSIS — R69 Illness, unspecified: Secondary | ICD-10-CM | POA: Diagnosis not present

## 2022-09-18 DIAGNOSIS — R69 Illness, unspecified: Secondary | ICD-10-CM | POA: Diagnosis not present

## 2022-09-19 DIAGNOSIS — R69 Illness, unspecified: Secondary | ICD-10-CM | POA: Diagnosis not present

## 2022-09-20 DIAGNOSIS — R69 Illness, unspecified: Secondary | ICD-10-CM | POA: Diagnosis not present

## 2022-09-21 DIAGNOSIS — R69 Illness, unspecified: Secondary | ICD-10-CM | POA: Diagnosis not present

## 2022-09-22 DIAGNOSIS — R69 Illness, unspecified: Secondary | ICD-10-CM | POA: Diagnosis not present

## 2022-09-23 DIAGNOSIS — R69 Illness, unspecified: Secondary | ICD-10-CM | POA: Diagnosis not present

## 2022-09-24 DIAGNOSIS — R69 Illness, unspecified: Secondary | ICD-10-CM | POA: Diagnosis not present

## 2022-09-25 DIAGNOSIS — R69 Illness, unspecified: Secondary | ICD-10-CM | POA: Diagnosis not present

## 2022-09-26 DIAGNOSIS — R69 Illness, unspecified: Secondary | ICD-10-CM | POA: Diagnosis not present

## 2022-09-27 DIAGNOSIS — R69 Illness, unspecified: Secondary | ICD-10-CM | POA: Diagnosis not present

## 2022-09-28 DIAGNOSIS — R69 Illness, unspecified: Secondary | ICD-10-CM | POA: Diagnosis not present

## 2022-09-29 DIAGNOSIS — R69 Illness, unspecified: Secondary | ICD-10-CM | POA: Diagnosis not present

## 2022-09-30 DIAGNOSIS — R69 Illness, unspecified: Secondary | ICD-10-CM | POA: Diagnosis not present

## 2022-10-01 DIAGNOSIS — R69 Illness, unspecified: Secondary | ICD-10-CM | POA: Diagnosis not present

## 2022-10-01 DIAGNOSIS — F01B18 Vascular dementia, moderate, with other behavioral disturbance: Secondary | ICD-10-CM | POA: Diagnosis not present

## 2022-10-01 DIAGNOSIS — F331 Major depressive disorder, recurrent, moderate: Secondary | ICD-10-CM | POA: Diagnosis not present

## 2022-10-01 DIAGNOSIS — G301 Alzheimer's disease with late onset: Secondary | ICD-10-CM | POA: Diagnosis not present

## 2022-10-02 DIAGNOSIS — R69 Illness, unspecified: Secondary | ICD-10-CM | POA: Diagnosis not present

## 2022-10-03 DIAGNOSIS — R69 Illness, unspecified: Secondary | ICD-10-CM | POA: Diagnosis not present

## 2022-10-04 DIAGNOSIS — R69 Illness, unspecified: Secondary | ICD-10-CM | POA: Diagnosis not present

## 2022-10-05 DIAGNOSIS — R69 Illness, unspecified: Secondary | ICD-10-CM | POA: Diagnosis not present

## 2022-10-06 DIAGNOSIS — R69 Illness, unspecified: Secondary | ICD-10-CM | POA: Diagnosis not present

## 2022-10-07 DIAGNOSIS — R69 Illness, unspecified: Secondary | ICD-10-CM | POA: Diagnosis not present

## 2022-10-08 DIAGNOSIS — R69 Illness, unspecified: Secondary | ICD-10-CM | POA: Diagnosis not present

## 2022-10-09 DIAGNOSIS — R69 Illness, unspecified: Secondary | ICD-10-CM | POA: Diagnosis not present

## 2022-10-10 DIAGNOSIS — R69 Illness, unspecified: Secondary | ICD-10-CM | POA: Diagnosis not present

## 2022-10-11 DIAGNOSIS — R69 Illness, unspecified: Secondary | ICD-10-CM | POA: Diagnosis not present

## 2022-10-12 DIAGNOSIS — R69 Illness, unspecified: Secondary | ICD-10-CM | POA: Diagnosis not present

## 2022-10-13 DIAGNOSIS — R69 Illness, unspecified: Secondary | ICD-10-CM | POA: Diagnosis not present

## 2022-10-14 DIAGNOSIS — R69 Illness, unspecified: Secondary | ICD-10-CM | POA: Diagnosis not present

## 2022-10-15 DIAGNOSIS — R69 Illness, unspecified: Secondary | ICD-10-CM | POA: Diagnosis not present

## 2022-10-16 DIAGNOSIS — R69 Illness, unspecified: Secondary | ICD-10-CM | POA: Diagnosis not present

## 2022-10-17 DIAGNOSIS — R69 Illness, unspecified: Secondary | ICD-10-CM | POA: Diagnosis not present

## 2022-10-18 DIAGNOSIS — R69 Illness, unspecified: Secondary | ICD-10-CM | POA: Diagnosis not present

## 2022-10-19 DIAGNOSIS — R69 Illness, unspecified: Secondary | ICD-10-CM | POA: Diagnosis not present

## 2022-10-20 DIAGNOSIS — R69 Illness, unspecified: Secondary | ICD-10-CM | POA: Diagnosis not present

## 2022-10-21 DIAGNOSIS — R69 Illness, unspecified: Secondary | ICD-10-CM | POA: Diagnosis not present

## 2022-10-22 DIAGNOSIS — R69 Illness, unspecified: Secondary | ICD-10-CM | POA: Diagnosis not present

## 2022-10-23 DIAGNOSIS — R69 Illness, unspecified: Secondary | ICD-10-CM | POA: Diagnosis not present

## 2022-10-24 DIAGNOSIS — R69 Illness, unspecified: Secondary | ICD-10-CM | POA: Diagnosis not present

## 2022-10-25 DIAGNOSIS — R69 Illness, unspecified: Secondary | ICD-10-CM | POA: Diagnosis not present

## 2022-10-26 DIAGNOSIS — R69 Illness, unspecified: Secondary | ICD-10-CM | POA: Diagnosis not present

## 2022-10-26 DIAGNOSIS — J209 Acute bronchitis, unspecified: Secondary | ICD-10-CM | POA: Diagnosis not present

## 2022-10-26 DIAGNOSIS — R051 Acute cough: Secondary | ICD-10-CM | POA: Diagnosis not present

## 2022-11-05 DIAGNOSIS — F01B18 Vascular dementia, moderate, with other behavioral disturbance: Secondary | ICD-10-CM | POA: Diagnosis not present

## 2022-11-05 DIAGNOSIS — R69 Illness, unspecified: Secondary | ICD-10-CM | POA: Diagnosis not present

## 2022-11-05 DIAGNOSIS — F331 Major depressive disorder, recurrent, moderate: Secondary | ICD-10-CM | POA: Diagnosis not present

## 2022-11-05 DIAGNOSIS — G301 Alzheimer's disease with late onset: Secondary | ICD-10-CM | POA: Diagnosis not present

## 2022-11-16 DIAGNOSIS — N3946 Mixed incontinence: Secondary | ICD-10-CM | POA: Diagnosis not present

## 2022-11-16 DIAGNOSIS — R32 Unspecified urinary incontinence: Secondary | ICD-10-CM | POA: Diagnosis not present

## 2022-11-16 DIAGNOSIS — R69 Illness, unspecified: Secondary | ICD-10-CM | POA: Diagnosis not present

## 2022-11-26 DIAGNOSIS — H25013 Cortical age-related cataract, bilateral: Secondary | ICD-10-CM | POA: Diagnosis not present

## 2022-11-26 DIAGNOSIS — H2513 Age-related nuclear cataract, bilateral: Secondary | ICD-10-CM | POA: Diagnosis not present

## 2022-11-30 DIAGNOSIS — Z0000 Encounter for general adult medical examination without abnormal findings: Secondary | ICD-10-CM | POA: Diagnosis not present

## 2022-12-03 DIAGNOSIS — R69 Illness, unspecified: Secondary | ICD-10-CM | POA: Diagnosis not present

## 2022-12-03 DIAGNOSIS — F01B18 Vascular dementia, moderate, with other behavioral disturbance: Secondary | ICD-10-CM | POA: Diagnosis not present

## 2022-12-03 DIAGNOSIS — F331 Major depressive disorder, recurrent, moderate: Secondary | ICD-10-CM | POA: Diagnosis not present

## 2022-12-03 DIAGNOSIS — G301 Alzheimer's disease with late onset: Secondary | ICD-10-CM | POA: Diagnosis not present

## 2023-01-04 DIAGNOSIS — R1311 Dysphagia, oral phase: Secondary | ICD-10-CM | POA: Diagnosis not present

## 2023-01-04 DIAGNOSIS — R634 Abnormal weight loss: Secondary | ICD-10-CM | POA: Diagnosis not present

## 2023-01-04 DIAGNOSIS — R69 Illness, unspecified: Secondary | ICD-10-CM | POA: Diagnosis not present

## 2023-01-14 DIAGNOSIS — F01B18 Vascular dementia, moderate, with other behavioral disturbance: Secondary | ICD-10-CM | POA: Diagnosis not present

## 2023-01-14 DIAGNOSIS — G301 Alzheimer's disease with late onset: Secondary | ICD-10-CM | POA: Diagnosis not present

## 2023-01-14 DIAGNOSIS — F331 Major depressive disorder, recurrent, moderate: Secondary | ICD-10-CM | POA: Diagnosis not present

## 2023-01-14 DIAGNOSIS — R69 Illness, unspecified: Secondary | ICD-10-CM | POA: Diagnosis not present

## 2023-02-25 DIAGNOSIS — F01B18 Vascular dementia, moderate, with other behavioral disturbance: Secondary | ICD-10-CM | POA: Diagnosis not present

## 2023-02-25 DIAGNOSIS — G301 Alzheimer's disease with late onset: Secondary | ICD-10-CM | POA: Diagnosis not present

## 2023-02-25 DIAGNOSIS — F331 Major depressive disorder, recurrent, moderate: Secondary | ICD-10-CM | POA: Diagnosis not present

## 2023-03-08 DIAGNOSIS — F02811 Dementia in other diseases classified elsewhere, unspecified severity, with agitation: Secondary | ICD-10-CM | POA: Diagnosis not present

## 2023-03-08 DIAGNOSIS — R1311 Dysphagia, oral phase: Secondary | ICD-10-CM | POA: Diagnosis not present

## 2023-03-08 DIAGNOSIS — F0150 Vascular dementia without behavioral disturbance: Secondary | ICD-10-CM | POA: Diagnosis not present

## 2023-03-23 DIAGNOSIS — F01B18 Vascular dementia, moderate, with other behavioral disturbance: Secondary | ICD-10-CM | POA: Diagnosis not present

## 2023-03-23 DIAGNOSIS — F331 Major depressive disorder, recurrent, moderate: Secondary | ICD-10-CM | POA: Diagnosis not present

## 2023-03-23 DIAGNOSIS — G301 Alzheimer's disease with late onset: Secondary | ICD-10-CM | POA: Diagnosis not present

## 2023-04-22 DIAGNOSIS — F01B18 Vascular dementia, moderate, with other behavioral disturbance: Secondary | ICD-10-CM | POA: Diagnosis not present

## 2023-04-22 DIAGNOSIS — G301 Alzheimer's disease with late onset: Secondary | ICD-10-CM | POA: Diagnosis not present

## 2023-04-22 DIAGNOSIS — F331 Major depressive disorder, recurrent, moderate: Secondary | ICD-10-CM | POA: Diagnosis not present

## 2023-05-06 DIAGNOSIS — G301 Alzheimer's disease with late onset: Secondary | ICD-10-CM | POA: Diagnosis not present

## 2023-05-06 DIAGNOSIS — F331 Major depressive disorder, recurrent, moderate: Secondary | ICD-10-CM | POA: Diagnosis not present

## 2023-05-06 DIAGNOSIS — F01B18 Vascular dementia, moderate, with other behavioral disturbance: Secondary | ICD-10-CM | POA: Diagnosis not present

## 2023-06-01 DIAGNOSIS — W500XXD Accidental hit or strike by another person, subsequent encounter: Secondary | ICD-10-CM | POA: Diagnosis not present

## 2023-06-01 DIAGNOSIS — F0150 Vascular dementia without behavioral disturbance: Secondary | ICD-10-CM | POA: Diagnosis not present

## 2023-06-03 DIAGNOSIS — F01B18 Vascular dementia, moderate, with other behavioral disturbance: Secondary | ICD-10-CM | POA: Diagnosis not present

## 2023-06-03 DIAGNOSIS — F331 Major depressive disorder, recurrent, moderate: Secondary | ICD-10-CM | POA: Diagnosis not present

## 2023-06-03 DIAGNOSIS — G301 Alzheimer's disease with late onset: Secondary | ICD-10-CM | POA: Diagnosis not present

## 2023-07-01 DIAGNOSIS — F01B18 Vascular dementia, moderate, with other behavioral disturbance: Secondary | ICD-10-CM | POA: Diagnosis not present

## 2023-07-01 DIAGNOSIS — F331 Major depressive disorder, recurrent, moderate: Secondary | ICD-10-CM | POA: Diagnosis not present

## 2023-07-01 DIAGNOSIS — G301 Alzheimer's disease with late onset: Secondary | ICD-10-CM | POA: Diagnosis not present

## 2023-07-14 DIAGNOSIS — H2513 Age-related nuclear cataract, bilateral: Secondary | ICD-10-CM | POA: Diagnosis not present

## 2023-07-14 DIAGNOSIS — H25013 Cortical age-related cataract, bilateral: Secondary | ICD-10-CM | POA: Diagnosis not present

## 2023-07-30 DIAGNOSIS — F01B18 Vascular dementia, moderate, with other behavioral disturbance: Secondary | ICD-10-CM | POA: Diagnosis not present

## 2023-07-30 DIAGNOSIS — G301 Alzheimer's disease with late onset: Secondary | ICD-10-CM | POA: Diagnosis not present

## 2023-07-30 DIAGNOSIS — F331 Major depressive disorder, recurrent, moderate: Secondary | ICD-10-CM | POA: Diagnosis not present

## 2023-08-12 DIAGNOSIS — G301 Alzheimer's disease with late onset: Secondary | ICD-10-CM | POA: Diagnosis not present

## 2023-08-12 DIAGNOSIS — F331 Major depressive disorder, recurrent, moderate: Secondary | ICD-10-CM | POA: Diagnosis not present

## 2023-08-12 DIAGNOSIS — F01B18 Vascular dementia, moderate, with other behavioral disturbance: Secondary | ICD-10-CM | POA: Diagnosis not present

## 2023-08-16 DIAGNOSIS — F03A18 Unspecified dementia, mild, with other behavioral disturbance: Secondary | ICD-10-CM | POA: Diagnosis not present

## 2023-08-16 DIAGNOSIS — G301 Alzheimer's disease with late onset: Secondary | ICD-10-CM | POA: Diagnosis not present

## 2023-08-16 DIAGNOSIS — N3946 Mixed incontinence: Secondary | ICD-10-CM | POA: Diagnosis not present

## 2023-09-13 DIAGNOSIS — L304 Erythema intertrigo: Secondary | ICD-10-CM | POA: Diagnosis not present

## 2023-09-13 DIAGNOSIS — G301 Alzheimer's disease with late onset: Secondary | ICD-10-CM | POA: Diagnosis not present

## 2023-09-16 DIAGNOSIS — F01B18 Vascular dementia, moderate, with other behavioral disturbance: Secondary | ICD-10-CM | POA: Diagnosis not present

## 2023-09-16 DIAGNOSIS — F331 Major depressive disorder, recurrent, moderate: Secondary | ICD-10-CM | POA: Diagnosis not present

## 2023-09-16 DIAGNOSIS — G301 Alzheimer's disease with late onset: Secondary | ICD-10-CM | POA: Diagnosis not present

## 2023-10-04 DIAGNOSIS — G301 Alzheimer's disease with late onset: Secondary | ICD-10-CM | POA: Diagnosis not present

## 2023-10-04 DIAGNOSIS — R296 Repeated falls: Secondary | ICD-10-CM | POA: Diagnosis not present

## 2023-10-04 DIAGNOSIS — F03A18 Unspecified dementia, mild, with other behavioral disturbance: Secondary | ICD-10-CM | POA: Diagnosis not present

## 2023-10-20 ENCOUNTER — Other Ambulatory Visit: Payer: Self-pay

## 2023-10-20 ENCOUNTER — Emergency Department (HOSPITAL_COMMUNITY)
Admission: EM | Admit: 2023-10-20 | Discharge: 2023-10-20 | Disposition: A | Discharge status: Skilled Nursing Facility | Payer: Medicare HMO | Attending: Emergency Medicine | Admitting: Emergency Medicine

## 2023-10-20 ENCOUNTER — Emergency Department (HOSPITAL_COMMUNITY): Payer: Medicare HMO

## 2023-10-20 DIAGNOSIS — W19XXXA Unspecified fall, initial encounter: Secondary | ICD-10-CM | POA: Diagnosis not present

## 2023-10-20 DIAGNOSIS — F0390 Unspecified dementia without behavioral disturbance: Secondary | ICD-10-CM | POA: Diagnosis not present

## 2023-10-20 DIAGNOSIS — R4182 Altered mental status, unspecified: Secondary | ICD-10-CM | POA: Diagnosis not present

## 2023-10-20 DIAGNOSIS — S0101XA Laceration without foreign body of scalp, initial encounter: Secondary | ICD-10-CM | POA: Diagnosis not present

## 2023-10-20 DIAGNOSIS — S0181XA Laceration without foreign body of other part of head, initial encounter: Secondary | ICD-10-CM | POA: Insufficient documentation

## 2023-10-20 DIAGNOSIS — R531 Weakness: Secondary | ICD-10-CM | POA: Diagnosis not present

## 2023-10-20 DIAGNOSIS — Z743 Need for continuous supervision: Secondary | ICD-10-CM | POA: Diagnosis not present

## 2023-10-20 DIAGNOSIS — E079 Disorder of thyroid, unspecified: Secondary | ICD-10-CM

## 2023-10-20 DIAGNOSIS — R58 Hemorrhage, not elsewhere classified: Secondary | ICD-10-CM | POA: Diagnosis not present

## 2023-10-20 DIAGNOSIS — G319 Degenerative disease of nervous system, unspecified: Secondary | ICD-10-CM | POA: Diagnosis not present

## 2023-10-20 DIAGNOSIS — S199XXA Unspecified injury of neck, initial encounter: Secondary | ICD-10-CM | POA: Diagnosis not present

## 2023-10-20 DIAGNOSIS — E0789 Other specified disorders of thyroid: Secondary | ICD-10-CM | POA: Insufficient documentation

## 2023-10-20 DIAGNOSIS — E041 Nontoxic single thyroid nodule: Secondary | ICD-10-CM | POA: Diagnosis not present

## 2023-10-20 DIAGNOSIS — R0902 Hypoxemia: Secondary | ICD-10-CM | POA: Diagnosis not present

## 2023-10-20 DIAGNOSIS — S0990XA Unspecified injury of head, initial encounter: Secondary | ICD-10-CM | POA: Diagnosis not present

## 2023-10-20 LAB — CBC WITH DIFFERENTIAL/PLATELET
Abs Immature Granulocytes: 0.04 10*3/uL (ref 0.00–0.07)
Basophils Absolute: 0.1 10*3/uL (ref 0.0–0.1)
Basophils Relative: 1 %
Eosinophils Absolute: 0.2 10*3/uL (ref 0.0–0.5)
Eosinophils Relative: 3 %
HCT: 41.1 % (ref 36.0–46.0)
Hemoglobin: 13 g/dL (ref 12.0–15.0)
Immature Granulocytes: 1 %
Lymphocytes Relative: 36 %
Lymphs Abs: 2.4 10*3/uL (ref 0.7–4.0)
MCH: 28.9 pg (ref 26.0–34.0)
MCHC: 31.6 g/dL (ref 30.0–36.0)
MCV: 91.3 fL (ref 80.0–100.0)
Monocytes Absolute: 0.5 10*3/uL (ref 0.1–1.0)
Monocytes Relative: 8 %
Neutro Abs: 3.4 10*3/uL (ref 1.7–7.7)
Neutrophils Relative %: 51 %
Platelets: 214 10*3/uL (ref 150–400)
RBC: 4.5 MIL/uL (ref 3.87–5.11)
RDW: 13.2 % (ref 11.5–15.5)
WBC: 6.5 10*3/uL (ref 4.0–10.5)
nRBC: 0 % (ref 0.0–0.2)

## 2023-10-20 LAB — COMPREHENSIVE METABOLIC PANEL
ALT: 12 U/L (ref 0–44)
AST: 14 U/L — ABNORMAL LOW (ref 15–41)
Albumin: 3.2 g/dL — ABNORMAL LOW (ref 3.5–5.0)
Alkaline Phosphatase: 53 U/L (ref 38–126)
Anion gap: 7 (ref 5–15)
BUN: 18 mg/dL (ref 8–23)
CO2: 27 mmol/L (ref 22–32)
Calcium: 8.9 mg/dL (ref 8.9–10.3)
Chloride: 105 mmol/L (ref 98–111)
Creatinine, Ser: 0.65 mg/dL (ref 0.44–1.00)
GFR, Estimated: >60 mL/min (ref >60)
Glucose, Bld: 102 mg/dL — ABNORMAL HIGH (ref 70–99)
Potassium: 3.9 mmol/L (ref 3.5–5.1)
Sodium: 139 mmol/L (ref 135–145)
Total Bilirubin: 0.7 mg/dL (ref 0.0–1.2)
Total Protein: 5.9 g/dL — ABNORMAL LOW (ref 6.5–8.1)

## 2023-10-20 MED ORDER — LIDOCAINE-EPINEPHRINE-TETRACAINE (LET) TOPICAL GEL
3.0000 mL | Freq: Once | TOPICAL | Status: AC
  Administered 2023-10-20: 3 mL via TOPICAL
  Filled 2023-10-20: qty 3

## 2023-10-20 MED ORDER — ACETAMINOPHEN 325 MG PO TABS
650.0000 mg | ORAL_TABLET | Freq: Once | ORAL | Status: AC
  Administered 2023-10-20: 650 mg via ORAL
  Filled 2023-10-20: qty 2

## 2023-10-20 MED ORDER — BACITRACIN ZINC 500 UNIT/GM EX OINT
TOPICAL_OINTMENT | Freq: Two times a day (BID) | CUTANEOUS | Status: DC
  Administered 2023-10-20 (×2): 1 via TOPICAL
  Filled 2023-10-20 (×2): qty 0.9

## 2023-10-20 NOTE — ED Notes (Signed)
Patient cheeked her tylenol and then chewed it up.

## 2023-10-20 NOTE — Discharge Instructions (Addendum)
Tracy Jenkins had 4 stitches placed in her left forehead.  These will need to be removed in the next 5 to 7 days. She had a CT scan in the emergency department.  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  This needs to be further evaluated by her doctor.
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
## 2023-10-20 NOTE — ED Provider Notes (Addendum)
Tracy Jenkins   CSN: [id number] Arrival date & time: 10/20/23  0047     History  Chief Complaint  Patient presents with   Fall    Patient brought in by [facility] from [facility]. Facility reported that they were doing shift change at 11 and when they walked in room patient has a laceration on her left eye brow laying on the floor looking like she was sleeping with clotted blood in her head.Patient baseline not being able to answer any questions with history of dementia and wouldn't tolerate C collar per medics.     Tracy Jenkins is a 83 y.o. female.  The history is provided by the patient, the EMS personnel and medical records.  Fall   Tracy Jenkins is a 83 y.o. female who presents to the Emergency Department complaining of fall.  She presents to the emergency department by EMS from [facility] for evaluation following unwitnessed fall.  At shift change she was found lying on the floor with blood on her head.  She has advanced dementia and at baseline cannot really answer questions.  When asked if she has pain to her head she states yes.  She is holding the right side of her neck but does not states she has neck pain.  She does not appear tender in this area.  She states no when asked about pain in other areas of her body.       Home Medications Prior to Admission medications   Medication Sig Start Date End Date Taking? Authorizing Provider  acetaminophen (TYLENOL) 500 MG tablet Take 500 mg by mouth every 6 (six) hours as needed for mild pain, fever or headache.    [provider]  alum & mag hydroxide-simeth (MI-ACID) 200-200-20 MG/5ML suspension Take 30 mLs by mouth every 6 (six) hours as needed for indigestion or heartburn.    [provider]  citalopram (CELEXA) 10 MG tablet Take 10 mg by mouth daily.    [provider]  divalproex (DEPAKOTE SPRINKLE) 125 MG capsule Take 250 mg by mouth 3  (three) times daily.    [provider]  guaifenesin (ROBITUSSIN) 100 MG/5ML syrup Take 200 mg by mouth every 6 (six) hours as needed for cough.    [provider]  loperamide (IMODIUM) 2 MG capsule Take 2 mg by mouth as needed for diarrhea or loose stools. Not to exceed 8 doses in 24 hours    [provider]  magnesium hydroxide (MILK OF MAGNESIA) 400 MG/5ML suspension Take 30 mLs by mouth at bedtime as needed for mild constipation.    [provider]  neomycin-bacitracin-polymyxin (NEOSPORIN) ointment Apply 1 application topically as needed for wound care.    [provider]  QUEtiapine (SEROQUEL) 25 MG tablet Take 25 mg by mouth 2 (two) times daily.    [provider]  traZODone (DESYREL) 50 MG tablet Take 50 mg by mouth at bedtime.    [provider]      Allergies    Patient has no known allergies.    Review of Systems   Review of Systems  Unable to perform ROS: Dementia    Physical Exam Updated Vital Signs BP 133/64 (BP Location: Right Arm)   Pulse (!) 54   Temp (!) 97.5 F (36.4 C) (Oral)   Resp 16   SpO2 98%  Physical Exam Vitals and nursing Jenkins reviewed.  Constitutional:      Appearance: She  is well-developed.  HENT:     Head: Normocephalic.     Comments: 2 cm laceration to the left temple, just superior to the eyebrow.  Pupils equal round and reactive, EOMI.  Tracks about the room Neck:     Comments: No cervical spine tenderness Cardiovascular:     Rate and Rhythm: Normal rate and regular rhythm.  Pulmonary:     Effort: Pulmonary effort is normal. No respiratory distress.  Abdominal:     Palpations: Abdomen is soft.     Tenderness: There is no abdominal tenderness. There is no guarding or rebound.  Musculoskeletal:     Comments: No tenderness on the arms, legs.  She is able to fully range all 4 extremities without difficulty.  Ecchymosis to the left without tenderness.  There is no pain on active ROM  of legs, arms.   Skin:    General: Skin is warm and dry.  Neurological:     Mental Status: She is alert.     Comments: Responds to her name.  Inappropriately answers all questions aside from a yes/no questions pertaining to pain.  She moves all extremities symmetrically and strongly.  Psychiatric:     Comments: Cooperative, smiling     ED Results / Procedures / Treatments   Labs (all labs ordered are listed, but only abnormal results are displayed) Labs Reviewed - No data to display  EKG None  Radiology CT Head Wo Contrast Result Date: 10/20/2023 CLINICAL DATA:  Larey Seat at nursing facility, head and neck trauma, history of dementia. C-collar in place. EXAM: CT HEAD WITHOUT CONTRAST CT CERVICAL SPINE WITHOUT CONTRAST TECHNIQUE: Multidetector CT imaging of the head and cervical spine was performed following the standard protocol without intravenous contrast. Multiplanar CT image reconstructions of the cervical spine were also generated. RADIATION DOSE REDUCTION: This exam was performed according to the departmental dose-optimization program which includes automated exposure control, adjustment of the mA and/or kV according to patient size and/or use of iterative reconstruction technique. COMPARISON:  Cervical spine CT and head CT both 05/31/2020. FINDINGS: CT HEAD FINDINGS Brain: There is stable moderately advanced atrophy with atrophic ventriculomegaly and advanced moderately confluent small-vessel disease of the cerebral white matter. No cortical based acute infarct, hemorrhage, mass or mass effect is seen. No midline shift. The basal cisterns are clear. There are benign dural calcifications along side of the falx. Vascular: There are calcific plaques in both siphons. No hyperdense central vessel is seen. Skull: Negative for fractures or focal lesions. No visible scalp hematoma. Small scalp laceration left brow area. Hyperostosis frontalis interna. Sinuses/Orbits: There is mild membrane disease in  the paranasal sinuses without fluid levels or mastoid effusion. Negative orbits. Other: None. CT CERVICAL SPINE FINDINGS Alignment: Minimal grade 1 degenerative anterolisthesis again noted at C3-4, C4-5 and C7-T1 and a slight cervical dextroscoliosis, all unchanged. No traumatic or further listhesis is seen. There is bone-on-bone anterior atlantodental joint space loss with osteophytes, also seen previously. Skull base and vertebrae: There is osteopenia. No acute fracture is evident. No primary bone lesion or focal pathologic process. Endplate Schmorl's nodes C5-6. Soft tissues and spinal canal: No prevertebral fluid or swelling. No visible canal hematoma. There are mild calcific plaques in the proximal cervical ICAs. 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 Nonemergent thyroid ultrasound follow-up is recommended. Disc levels: Chronic disc collapse and bidirectional endplate osteophytes are again noted at C5-6,  without spondylotic cord compression. There is slight to mild disc space loss at the other cervical levels with small anterior endplate spurring greatest at C6-7. No other significant posterior osteophytes. There are no herniated discs or cord compromise. Left-greater-than-right facet joint and uncinate hypertrophy results in severe left foraminal narrowing C2-3 and C3-4, mild-to-moderate left foraminal narrowing at C4-5. Other foramina are not significantly stenotic. Upper chest: Negative. Other: None. IMPRESSION: 1. No acute intracranial CT findings or depressed skull fractures. 2. Small scalp laceration left brow area. 3. Stable atrophy with advanced small-vessel disease. 4. Osteopenia and degenerative change without evidence of cervical fractures. 5. 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 Nonemergent thyroid ultrasound follow-up is  recommended. Electronically Signed   By: Almira Bar M.D.   On: 10/20/2023 03:08   CT Cervical Spine Wo Contrast Result Date: 10/20/2023 CLINICAL DATA:  Larey Seat at nursing facility, head and neck trauma, history of dementia. C-collar in place. EXAM: CT HEAD WITHOUT CONTRAST CT CERVICAL SPINE WITHOUT CONTRAST TECHNIQUE: Multidetector CT imaging of the head and cervical spine was performed following the standard protocol without intravenous contrast. Multiplanar CT image reconstructions of the cervical spine were also generated. RADIATION DOSE REDUCTION: This exam was performed according to the departmental dose-optimization program which includes automated exposure control, adjustment of the mA and/or kV according to patient size and/or use of iterative reconstruction technique. COMPARISON:  Cervical spine CT and head CT both 05/31/2020. FINDINGS: CT HEAD FINDINGS Brain: There is stable moderately advanced atrophy with atrophic ventriculomegaly and advanced moderately confluent small-vessel disease of the cerebral white matter. No cortical based acute infarct, hemorrhage, mass or mass effect is seen. No midline shift. The basal cisterns are clear. There are benign dural calcifications along side of the falx. Vascular: There are calcific plaques in both siphons. No hyperdense central vessel is seen. Skull: Negative for fractures or focal lesions. No visible scalp hematoma. Small scalp laceration left brow area. Hyperostosis frontalis interna. Sinuses/Orbits: There is mild membrane disease in the paranasal sinuses without fluid levels or mastoid effusion. Negative orbits. Other: None. CT CERVICAL SPINE FINDINGS Alignment: Minimal grade 1 degenerative anterolisthesis again noted at C3-4, C4-5 and C7-T1 and a slight cervical dextroscoliosis, all unchanged. No traumatic or further listhesis is seen. There is bone-on-bone anterior atlantodental joint space loss with osteophytes, also seen previously. Skull base and  vertebrae: There is osteopenia. No acute fracture is evident. No primary bone lesion or focal pathologic process. Endplate Schmorl's nodes C5-6. Soft tissues and spinal canal: No prevertebral fluid or swelling. No visible canal hematoma. There are mild calcific plaques in the proximal cervical ICAs. 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 Nonemergent thyroid ultrasound follow-up is recommended. Disc levels: Chronic disc collapse and bidirectional endplate osteophytes are again noted at C5-6, without spondylotic cord compression. There is slight to mild disc space loss at the other cervical levels with small anterior endplate spurring greatest at C6-7. No other significant posterior osteophytes. There are no herniated discs or cord compromise. Left-greater-than-right facet joint and uncinate hypertrophy results in severe left foraminal narrowing C2-3 and C3-4, mild-to-moderate left foraminal narrowing at C4-5. Other foramina are not significantly stenotic. Upper chest: Negative. Other: None. IMPRESSION: 1. No acute intracranial CT findings or depressed skull fractures. 2. Small scalp laceration left brow area. 3. Stable atrophy with advanced  small-vessel disease. 4. Osteopenia and degenerative change without evidence of cervical fractures. 5. 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 Nonemergent thyroid ultrasound follow-up is recommended. Electronically Signed   By: Almira Bar M.D.   On: 10/20/2023 03:08    Procedures .Laceration Repair  Date/Time: 10/20/2023 4:21 AM  Performed by: Tilden Fossa, MD Authorized by: Tilden Fossa, MD   Consent:    Consent obtained:  Emergent situation   Risks discussed:  Infection, pain and poor cosmetic result Universal protocol:    Patient identity confirmed:  Hospital-assigned identification  number Anesthesia:    Anesthesia method:  Topical application   Topical anesthetic:  LET Laceration details:    Location:  Face   Face location:  Forehead   Length (cm):  2 Pre-procedure details:    Preparation:  Patient was prepped and draped in usual sterile fashion Exploration:    Limited defect created (wound extended): no     Hemostasis achieved with:  LET   Contaminated: no   Treatment:    Area cleansed with:  Chlorhexidine and saline   Amount of cleaning:  Standard   Irrigation solution:  Sterile saline   Debridement:  None Skin repair:    Repair method:  Sutures   Suture size:  4-0   Suture material:  Prolene   Suture technique:  Simple interrupted   Number of sutures:  4 Approximation:    Approximation:  Close Repair type:    Repair type:  Simple Post-procedure details:    Dressing:  Antibiotic ointment and adhesive bandage   Procedure completion:  Tolerated well, no immediate complications     Medications Ordered in ED Medications  bacitracin ointment (has no administration in time range)  acetaminophen (TYLENOL) tablet 650 mg (650 mg Oral Given 10/20/23 0203)  lidocaine-EPINEPHrine-tetracaine (LET) topical gel (3 mLs Topical Given 10/20/23 0203)    ED Course/ Medical Decision Making/ A&P                                 Medical Decision Making Amount and/or Complexity of Data Reviewed Labs: ordered. Radiology: ordered.  Risk OTC drugs.   Patient with history of vascular dementia here for evaluation following an unwitnessed fall.  She does have a laceration to the left forehead, repaired per Jenkins.  She was holding her neck but denied neck pain when she arrived, she was placed in a cervical collar pending CT scan.  CT scan demonstrates enlarging thyroid mass but no evidence of acute fracture.  She is able to range her neck without difficulty and c-collar was discontinued.  Attempted to contact patient's daughter, son via notes provided in the records  without answer.  Also attempted to contact [facility] without answer.  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  Patient is not on anticoagulants.  She appears well-hydrated on examination.  Do not feel labs at this time will be of benefit.  No evidence of additional injury secondary to her fall.  Per EMS report mental status is baseline.  Plan to discharge back to facility.  Will leave a voicemail for patient's son regarding need for additional follow-up for her neck mass.   Addendum - able to discuss patient with daughter over the phone CT scan findings.  Daughter requests labs due to increased bruising lately.  Plan to check basic  labs.  Pt care transferred pending labs.      Final Clinical Impression(s) / ED Diagnoses Final diagnoses:  Fall, initial encounter  Facial laceration, initial encounter  Thyroid mass    Rx / DC Orders ED Discharge Orders     None         Tilden Fossa, MD 10/20/23 0441    Tilden Fossa, MD 10/20/23 [phone]

## 2023-10-20 NOTE — ED Notes (Signed)
Called [facility] to cancel

## 2023-10-20 NOTE — ED Notes (Signed)
Bandage changed over left eye, bactrim applied

## 2023-10-20 NOTE — ED Notes (Signed)
Pt was able to ambulate from bed to doorway and back to bed with assist

## 2023-10-20 NOTE — ED Notes (Signed)
C collar in place as requested from provider at this time.

## 2023-10-20 NOTE — ED Notes (Signed)
[facility] contacted 

## 2023-10-25 DIAGNOSIS — G301 Alzheimer's disease with late onset: Secondary | ICD-10-CM | POA: Diagnosis not present

## 2023-10-25 DIAGNOSIS — N3946 Mixed incontinence: Secondary | ICD-10-CM | POA: Diagnosis not present

## 2023-10-25 DIAGNOSIS — R2689 Other abnormalities of gait and mobility: Secondary | ICD-10-CM | POA: Diagnosis not present

## 2023-10-25 DIAGNOSIS — S0083XD Contusion of other part of head, subsequent encounter: Secondary | ICD-10-CM | POA: Diagnosis not present

## 2023-10-25 DIAGNOSIS — S8002XD Contusion of left knee, subsequent encounter: Secondary | ICD-10-CM | POA: Diagnosis not present

## 2023-11-01 ENCOUNTER — Other Ambulatory Visit: Payer: Self-pay

## 2023-11-01 ENCOUNTER — Emergency Department (HOSPITAL_COMMUNITY)
Admission: EM | Admit: 2023-11-01 | Discharge: 2023-11-01 | Disposition: A | Discharge status: Nursing Home (Includes ICF) | Payer: Medicare HMO | Attending: Emergency Medicine | Admitting: Emergency Medicine

## 2023-11-01 DIAGNOSIS — R0902 Hypoxemia: Secondary | ICD-10-CM | POA: Diagnosis not present

## 2023-11-01 DIAGNOSIS — F0390 Unspecified dementia without behavioral disturbance: Secondary | ICD-10-CM | POA: Diagnosis not present

## 2023-11-01 DIAGNOSIS — Z4802 Encounter for removal of sutures: Secondary | ICD-10-CM | POA: Diagnosis present

## 2023-11-01 DIAGNOSIS — I10 Essential (primary) hypertension: Secondary | ICD-10-CM | POA: Diagnosis not present

## 2023-11-01 DIAGNOSIS — R4182 Altered mental status, unspecified: Secondary | ICD-10-CM | POA: Diagnosis not present

## 2023-11-01 DIAGNOSIS — Z7401 Bed confinement status: Secondary | ICD-10-CM | POA: Diagnosis not present

## 2023-11-01 DIAGNOSIS — Z743 Need for continuous supervision: Secondary | ICD-10-CM | POA: Diagnosis not present

## 2023-11-01 NOTE — ED Notes (Signed)
RN called [facility] to have patient transported back to [facility] and RN called and gave report to nurse

## 2023-11-01 NOTE — ED Provider Notes (Signed)
[place] EMERGENCY DEPARTMENT AT [facility] Provider Note   CSN: [id number] Arrival date & time: 11/01/23  1042     History  Chief Complaint  Patient presents with   Suture / Staple Removal    Tracy Jenkins is a 83 y.o. female.   Suture / Staple Removal     Patient has a history of vascular dementia.  She resides at a nursing facility.  She was seen in the emergency room on January 22 after a fall.  Patient sustained a laceration to her left forehead above her eyebrow.  Patient received 4 sutures.  Patient was sent to the ED here today to have suture removal.  Patient denies any complaints  Home Medications Prior to Admission medications   Medication Sig Start Date End Date Taking? Authorizing Provider  acetaminophen (TYLENOL) 500 MG tablet Take 500 mg by mouth every 6 (six) hours as needed for mild pain, fever or headache.    [provider]  alum & mag hydroxide-simeth (MI-ACID) 200-200-20 MG/5ML suspension Take 30 mLs by mouth every 6 (six) hours as needed for indigestion or heartburn.    [provider]  citalopram (CELEXA) 10 MG tablet Take 10 mg by mouth daily.    [provider]  divalproex (DEPAKOTE SPRINKLE) 125 MG capsule Take 250 mg by mouth 3 (three) times daily.    [provider]  guaifenesin (ROBITUSSIN) 100 MG/5ML syrup Take 200 mg by mouth every 6 (six) hours as needed for cough.    [provider]  loperamide (IMODIUM) 2 MG capsule Take 2 mg by mouth as needed for diarrhea or loose stools. Not to exceed 8 doses in 24 hours    [provider]  magnesium hydroxide (MILK OF MAGNESIA) 400 MG/5ML suspension Take 30 mLs by mouth at bedtime as needed for mild constipation.    [provider]  neomycin-bacitracin-polymyxin (NEOSPORIN) ointment Apply 1 application topically as needed for wound care.    [provider]  QUEtiapine (SEROQUEL) 25 MG tablet Take 25 mg by mouth 2 (two) times  daily.    [provider]  traZODone (DESYREL) 50 MG tablet Take 50 mg by mouth at bedtime.    [provider]      Allergies    Patient has no known allergies.    Review of Systems   Review of Systems  Physical Exam Updated Vital Signs BP 131/70 (BP Location: Left Arm)   Pulse 69   Temp 98.5 F (36.9 C) (Oral)   Resp 16   SpO2 100%  Physical Exam Vitals and nursing note reviewed.  Constitutional:      General: She is not in acute distress.    Appearance: She is well-developed.  HENT:     Head: Normocephalic.     Comments: Well-healed laceration above left forehead, 4 sutures in place Eyes:     General: No scleral icterus.       Right eye: No discharge.        Left eye: No discharge.     Conjunctiva/sclera: Conjunctivae normal.  Neck:     Trachea: No tracheal deviation.  Cardiovascular:     Rate and Rhythm: Normal rate.  Pulmonary:     Effort: Pulmonary effort is normal. No respiratory distress.     Breath sounds: No stridor.  Musculoskeletal:        General: No deformity.     Cervical back: Neck supple.  Skin:    General: Skin is warm  and dry.     Findings: No rash.  Neurological:     Mental Status: She is alert. Mental status is at baseline.     Cranial Nerves: No dysarthria or facial asymmetry.     Motor: No seizure activity.     ED Results / Procedures / Treatments   Labs (all labs ordered are listed, but only abnormal results are displayed) Labs Reviewed - No data to display  EKG None  Radiology No results found.  Procedures Suture Removal  Date/Time: 11/01/2023 11:04 AM  Performed by: Linwood Dibbles, MD Authorized by: Linwood Dibbles, MD   Consent:    Consent obtained:  Verbal   Consent given by:  Patient Procedure details:    Wound appearance:  No signs of infection, good wound healing and clean   Number of sutures removed:  4 Post-procedure details:    Procedure completion:  Tolerated well, no immediate complications      Medications Ordered in ED Medications - No data to display  ED Course/ Medical Decision Making/ A&P                                 Medical Decision Making Problems Addressed: Visit for suture removal: self-limited or minor problem  Wound appears to be healing well.  No signs of infection.  Sutures removed without difficulty         Final Clinical Impression(s) / ED Diagnoses Final diagnoses:  Visit for suture removal    Rx / DC Orders ED Discharge Orders     None         Linwood Dibbles, MD 11/01/23 1104

## 2023-11-01 NOTE — ED Triage Notes (Signed)
Patient to ED by EMS from [facility] for suture removal

## 2023-11-10 DIAGNOSIS — F02811 Dementia in other diseases classified elsewhere, unspecified severity, with agitation: Secondary | ICD-10-CM | POA: Diagnosis not present

## 2023-11-10 DIAGNOSIS — N3946 Mixed incontinence: Secondary | ICD-10-CM | POA: Diagnosis not present

## 2023-11-10 DIAGNOSIS — G301 Alzheimer's disease with late onset: Secondary | ICD-10-CM | POA: Diagnosis not present

## 2023-11-10 DIAGNOSIS — Z87440 Personal history of urinary (tract) infections: Secondary | ICD-10-CM | POA: Diagnosis not present

## 2023-11-16 DIAGNOSIS — W010XXD Fall on same level from slipping, tripping and stumbling without subsequent striking against object, subsequent encounter: Secondary | ICD-10-CM | POA: Diagnosis not present

## 2023-11-16 DIAGNOSIS — G301 Alzheimer's disease with late onset: Secondary | ICD-10-CM | POA: Diagnosis not present

## 2023-11-16 DIAGNOSIS — R4182 Altered mental status, unspecified: Secondary | ICD-10-CM | POA: Diagnosis not present

## 2023-11-26 DIAGNOSIS — Z7689 Persons encountering health services in other specified circumstances: Secondary | ICD-10-CM | POA: Diagnosis not present

## 2023-11-26 DIAGNOSIS — E079 Disorder of thyroid, unspecified: Secondary | ICD-10-CM | POA: Diagnosis not present

## 2023-11-26 DIAGNOSIS — R7309 Other abnormal glucose: Secondary | ICD-10-CM | POA: Diagnosis not present

## 2023-11-26 DIAGNOSIS — G301 Alzheimer's disease with late onset: Secondary | ICD-10-CM | POA: Diagnosis not present

## 2023-11-26 DIAGNOSIS — F0280 Dementia in other diseases classified elsewhere without behavioral disturbance: Secondary | ICD-10-CM | POA: Diagnosis not present

## 2023-12-23 DIAGNOSIS — E041 Nontoxic single thyroid nodule: Secondary | ICD-10-CM | POA: Diagnosis not present

## 2024-03-16 DIAGNOSIS — F419 Anxiety disorder, unspecified: Secondary | ICD-10-CM | POA: Diagnosis not present

## 2024-03-23 DIAGNOSIS — B351 Tinea unguium: Secondary | ICD-10-CM | POA: Diagnosis not present

## 2024-03-23 DIAGNOSIS — R2689 Other abnormalities of gait and mobility: Secondary | ICD-10-CM | POA: Diagnosis not present

## 2024-03-23 DIAGNOSIS — M79674 Pain in right toe(s): Secondary | ICD-10-CM | POA: Diagnosis not present

## 2024-03-23 DIAGNOSIS — M79675 Pain in left toe(s): Secondary | ICD-10-CM | POA: Diagnosis not present

## 2024-03-23 DIAGNOSIS — L851 Acquired keratosis [keratoderma] palmaris et plantaris: Secondary | ICD-10-CM | POA: Diagnosis not present

## 2024-03-23 DIAGNOSIS — L603 Nail dystrophy: Secondary | ICD-10-CM | POA: Diagnosis not present

## 2024-03-23 DIAGNOSIS — F0390 Unspecified dementia without behavioral disturbance: Secondary | ICD-10-CM | POA: Diagnosis not present

## 2024-05-02 DIAGNOSIS — H25013 Cortical age-related cataract, bilateral: Secondary | ICD-10-CM | POA: Diagnosis not present

## 2024-05-02 DIAGNOSIS — H2513 Age-related nuclear cataract, bilateral: Secondary | ICD-10-CM | POA: Diagnosis not present

## 2024-05-03 DIAGNOSIS — R4182 Altered mental status, unspecified: Secondary | ICD-10-CM | POA: Diagnosis not present

## 2024-05-03 DIAGNOSIS — F03A18 Unspecified dementia, mild, with other behavioral disturbance: Secondary | ICD-10-CM | POA: Diagnosis not present

## 2024-05-03 DIAGNOSIS — G301 Alzheimer's disease with late onset: Secondary | ICD-10-CM | POA: Diagnosis not present

## 2024-05-03 DIAGNOSIS — Z741 Need for assistance with personal care: Secondary | ICD-10-CM | POA: Diagnosis not present

## 2024-05-10 DIAGNOSIS — N3946 Mixed incontinence: Secondary | ICD-10-CM | POA: Diagnosis not present

## 2024-05-10 DIAGNOSIS — G301 Alzheimer's disease with late onset: Secondary | ICD-10-CM | POA: Diagnosis not present

## 2024-05-10 DIAGNOSIS — R2689 Other abnormalities of gait and mobility: Secondary | ICD-10-CM | POA: Diagnosis not present

## 2024-05-10 DIAGNOSIS — Z741 Need for assistance with personal care: Secondary | ICD-10-CM | POA: Diagnosis not present

## 2024-05-10 DIAGNOSIS — Z0130 Encounter for examination of blood pressure without abnormal findings: Secondary | ICD-10-CM | POA: Diagnosis not present

## 2024-05-10 DIAGNOSIS — F02811 Dementia in other diseases classified elsewhere, unspecified severity, with agitation: Secondary | ICD-10-CM | POA: Diagnosis not present

## 2024-05-17 DIAGNOSIS — N3946 Mixed incontinence: Secondary | ICD-10-CM | POA: Diagnosis not present

## 2024-05-17 DIAGNOSIS — G301 Alzheimer's disease with late onset: Secondary | ICD-10-CM | POA: Diagnosis not present

## 2024-05-18 DIAGNOSIS — Z9183 Wandering in diseases classified elsewhere: Secondary | ICD-10-CM | POA: Diagnosis not present

## 2024-05-18 DIAGNOSIS — Z01818 Encounter for other preprocedural examination: Secondary | ICD-10-CM | POA: Diagnosis not present

## 2024-05-18 DIAGNOSIS — Z736 Limitation of activities due to disability: Secondary | ICD-10-CM | POA: Diagnosis not present

## 2024-05-18 DIAGNOSIS — W19XXXD Unspecified fall, subsequent encounter: Secondary | ICD-10-CM | POA: Diagnosis not present

## 2024-05-18 DIAGNOSIS — R102 Pelvic and perineal pain: Secondary | ICD-10-CM | POA: Diagnosis not present

## 2024-05-18 DIAGNOSIS — I959 Hypotension, unspecified: Secondary | ICD-10-CM | POA: Diagnosis not present

## 2024-05-18 DIAGNOSIS — D62 Acute posthemorrhagic anemia: Secondary | ICD-10-CM | POA: Diagnosis not present

## 2024-05-18 DIAGNOSIS — Z66 Do not resuscitate: Secondary | ICD-10-CM | POA: Diagnosis not present

## 2024-05-18 DIAGNOSIS — W1811XA Fall from or off toilet without subsequent striking against object, initial encounter: Secondary | ICD-10-CM | POA: Diagnosis not present

## 2024-05-18 DIAGNOSIS — E559 Vitamin D deficiency, unspecified: Secondary | ICD-10-CM | POA: Diagnosis not present

## 2024-05-18 DIAGNOSIS — K5900 Constipation, unspecified: Secondary | ICD-10-CM | POA: Diagnosis not present

## 2024-05-18 DIAGNOSIS — Z743 Need for continuous supervision: Secondary | ICD-10-CM | POA: Diagnosis not present

## 2024-05-18 DIAGNOSIS — R339 Retention of urine, unspecified: Secondary | ICD-10-CM | POA: Diagnosis not present

## 2024-05-18 DIAGNOSIS — S72001D Fracture of unspecified part of neck of right femur, subsequent encounter for closed fracture with routine healing: Secondary | ICD-10-CM | POA: Diagnosis not present

## 2024-05-18 DIAGNOSIS — M25551 Pain in right hip: Secondary | ICD-10-CM | POA: Diagnosis not present

## 2024-05-18 DIAGNOSIS — Z9889 Other specified postprocedural states: Secondary | ICD-10-CM | POA: Diagnosis not present

## 2024-05-18 DIAGNOSIS — E041 Nontoxic single thyroid nodule: Secondary | ICD-10-CM | POA: Diagnosis not present

## 2024-05-18 DIAGNOSIS — F03C0 Unspecified dementia, severe, without behavioral disturbance, psychotic disturbance, mood disturbance, and anxiety: Secondary | ICD-10-CM | POA: Diagnosis not present

## 2024-05-18 DIAGNOSIS — R296 Repeated falls: Secondary | ICD-10-CM | POA: Diagnosis not present

## 2024-05-18 DIAGNOSIS — F03C18 Unspecified dementia, severe, with other behavioral disturbance: Secondary | ICD-10-CM | POA: Diagnosis not present

## 2024-05-18 DIAGNOSIS — S72141A Displaced intertrochanteric fracture of right femur, initial encounter for closed fracture: Secondary | ICD-10-CM | POA: Diagnosis not present

## 2024-05-25 DIAGNOSIS — Z743 Need for continuous supervision: Secondary | ICD-10-CM | POA: Diagnosis not present

## 2024-05-25 DIAGNOSIS — E041 Nontoxic single thyroid nodule: Secondary | ICD-10-CM | POA: Diagnosis not present

## 2024-05-25 DIAGNOSIS — F0390 Unspecified dementia without behavioral disturbance: Secondary | ICD-10-CM | POA: Diagnosis not present

## 2024-05-25 DIAGNOSIS — F0393 Unspecified dementia, unspecified severity, with mood disturbance: Secondary | ICD-10-CM | POA: Diagnosis not present

## 2024-05-25 DIAGNOSIS — I959 Hypotension, unspecified: Secondary | ICD-10-CM | POA: Diagnosis not present

## 2024-05-25 DIAGNOSIS — K5900 Constipation, unspecified: Secondary | ICD-10-CM | POA: Diagnosis not present

## 2024-05-25 DIAGNOSIS — E559 Vitamin D deficiency, unspecified: Secondary | ICD-10-CM | POA: Diagnosis not present

## 2024-05-25 DIAGNOSIS — W19XXXD Unspecified fall, subsequent encounter: Secondary | ICD-10-CM | POA: Diagnosis not present

## 2024-05-25 DIAGNOSIS — Z7901 Long term (current) use of anticoagulants: Secondary | ICD-10-CM | POA: Diagnosis not present

## 2024-05-25 DIAGNOSIS — S72141D Displaced intertrochanteric fracture of right femur, subsequent encounter for closed fracture with routine healing: Secondary | ICD-10-CM | POA: Diagnosis not present

## 2024-05-25 DIAGNOSIS — Z299 Encounter for prophylactic measures, unspecified: Secondary | ICD-10-CM | POA: Diagnosis not present

## 2024-05-25 DIAGNOSIS — Z9183 Wandering in diseases classified elsewhere: Secondary | ICD-10-CM | POA: Diagnosis not present

## 2024-05-25 DIAGNOSIS — S72001D Fracture of unspecified part of neck of right femur, subsequent encounter for closed fracture with routine healing: Secondary | ICD-10-CM | POA: Diagnosis not present

## 2024-05-25 DIAGNOSIS — F03C18 Unspecified dementia, severe, with other behavioral disturbance: Secondary | ICD-10-CM | POA: Diagnosis not present

## 2024-05-25 DIAGNOSIS — R339 Retention of urine, unspecified: Secondary | ICD-10-CM | POA: Diagnosis not present

## 2024-05-25 DIAGNOSIS — G4700 Insomnia, unspecified: Secondary | ICD-10-CM | POA: Diagnosis not present

## 2024-05-25 DIAGNOSIS — Z9181 History of falling: Secondary | ICD-10-CM | POA: Diagnosis not present

## 2024-05-25 DIAGNOSIS — Z8639 Personal history of other endocrine, nutritional and metabolic disease: Secondary | ICD-10-CM | POA: Diagnosis not present

## 2024-05-25 DIAGNOSIS — S72141A Displaced intertrochanteric fracture of right femur, initial encounter for closed fracture: Secondary | ICD-10-CM | POA: Diagnosis not present

## 2024-05-30 DIAGNOSIS — Z8639 Personal history of other endocrine, nutritional and metabolic disease: Secondary | ICD-10-CM | POA: Diagnosis not present

## 2024-05-30 DIAGNOSIS — Z9181 History of falling: Secondary | ICD-10-CM | POA: Diagnosis not present

## 2024-05-30 DIAGNOSIS — F0390 Unspecified dementia without behavioral disturbance: Secondary | ICD-10-CM | POA: Diagnosis not present

## 2024-05-30 DIAGNOSIS — K5900 Constipation, unspecified: Secondary | ICD-10-CM | POA: Diagnosis not present

## 2024-05-30 DIAGNOSIS — S72141A Displaced intertrochanteric fracture of right femur, initial encounter for closed fracture: Secondary | ICD-10-CM | POA: Diagnosis not present

## 2024-05-30 DIAGNOSIS — R339 Retention of urine, unspecified: Secondary | ICD-10-CM | POA: Diagnosis not present

## 2024-05-30 DIAGNOSIS — Z7901 Long term (current) use of anticoagulants: Secondary | ICD-10-CM | POA: Diagnosis not present

## 2024-06-01 DIAGNOSIS — G4700 Insomnia, unspecified: Secondary | ICD-10-CM | POA: Diagnosis not present

## 2024-06-01 DIAGNOSIS — F0393 Unspecified dementia, unspecified severity, with mood disturbance: Secondary | ICD-10-CM | POA: Diagnosis not present

## 2024-06-01 DIAGNOSIS — E041 Nontoxic single thyroid nodule: Secondary | ICD-10-CM | POA: Diagnosis not present

## 2024-06-01 DIAGNOSIS — S72141A Displaced intertrochanteric fracture of right femur, initial encounter for closed fracture: Secondary | ICD-10-CM | POA: Diagnosis not present

## 2024-06-02 DIAGNOSIS — K5900 Constipation, unspecified: Secondary | ICD-10-CM | POA: Diagnosis not present

## 2024-06-02 DIAGNOSIS — S72141D Displaced intertrochanteric fracture of right femur, subsequent encounter for closed fracture with routine healing: Secondary | ICD-10-CM | POA: Diagnosis not present

## 2024-06-02 DIAGNOSIS — Z7901 Long term (current) use of anticoagulants: Secondary | ICD-10-CM | POA: Diagnosis not present

## 2024-06-02 DIAGNOSIS — F0393 Unspecified dementia, unspecified severity, with mood disturbance: Secondary | ICD-10-CM | POA: Diagnosis not present

## 2024-06-05 DIAGNOSIS — K5900 Constipation, unspecified: Secondary | ICD-10-CM | POA: Diagnosis not present

## 2024-06-05 DIAGNOSIS — F0393 Unspecified dementia, unspecified severity, with mood disturbance: Secondary | ICD-10-CM | POA: Diagnosis not present

## 2024-06-05 DIAGNOSIS — Z299 Encounter for prophylactic measures, unspecified: Secondary | ICD-10-CM | POA: Diagnosis not present

## 2024-06-05 DIAGNOSIS — Z7901 Long term (current) use of anticoagulants: Secondary | ICD-10-CM | POA: Diagnosis not present

## 2024-06-05 DIAGNOSIS — E041 Nontoxic single thyroid nodule: Secondary | ICD-10-CM | POA: Diagnosis not present

## 2024-06-05 DIAGNOSIS — S72141D Displaced intertrochanteric fracture of right femur, subsequent encounter for closed fracture with routine healing: Secondary | ICD-10-CM | POA: Diagnosis not present

## 2024-06-05 DIAGNOSIS — Z9181 History of falling: Secondary | ICD-10-CM | POA: Diagnosis not present

## 2024-06-05 DIAGNOSIS — G4700 Insomnia, unspecified: Secondary | ICD-10-CM | POA: Diagnosis not present

## 2024-06-07 DIAGNOSIS — E041 Nontoxic single thyroid nodule: Secondary | ICD-10-CM | POA: Diagnosis not present

## 2024-06-07 DIAGNOSIS — F0393 Unspecified dementia, unspecified severity, with mood disturbance: Secondary | ICD-10-CM | POA: Diagnosis not present

## 2024-06-07 DIAGNOSIS — Z9181 History of falling: Secondary | ICD-10-CM | POA: Diagnosis not present

## 2024-06-07 DIAGNOSIS — G4700 Insomnia, unspecified: Secondary | ICD-10-CM | POA: Diagnosis not present

## 2024-06-07 DIAGNOSIS — Z7901 Long term (current) use of anticoagulants: Secondary | ICD-10-CM | POA: Diagnosis not present

## 2024-06-07 DIAGNOSIS — K5900 Constipation, unspecified: Secondary | ICD-10-CM | POA: Diagnosis not present

## 2024-06-07 DIAGNOSIS — S72141D Displaced intertrochanteric fracture of right femur, subsequent encounter for closed fracture with routine healing: Secondary | ICD-10-CM | POA: Diagnosis not present

## 2024-06-07 DIAGNOSIS — Z299 Encounter for prophylactic measures, unspecified: Secondary | ICD-10-CM | POA: Diagnosis not present

## 2024-06-09 DIAGNOSIS — Z7901 Long term (current) use of anticoagulants: Secondary | ICD-10-CM | POA: Diagnosis not present

## 2024-06-09 DIAGNOSIS — G4700 Insomnia, unspecified: Secondary | ICD-10-CM | POA: Diagnosis not present

## 2024-06-09 DIAGNOSIS — Z9181 History of falling: Secondary | ICD-10-CM | POA: Diagnosis not present

## 2024-06-09 DIAGNOSIS — Z299 Encounter for prophylactic measures, unspecified: Secondary | ICD-10-CM | POA: Diagnosis not present

## 2024-06-09 DIAGNOSIS — K5900 Constipation, unspecified: Secondary | ICD-10-CM | POA: Diagnosis not present

## 2024-06-09 DIAGNOSIS — E041 Nontoxic single thyroid nodule: Secondary | ICD-10-CM | POA: Diagnosis not present

## 2024-06-09 DIAGNOSIS — F0393 Unspecified dementia, unspecified severity, with mood disturbance: Secondary | ICD-10-CM | POA: Diagnosis not present

## 2024-06-09 DIAGNOSIS — S72141D Displaced intertrochanteric fracture of right femur, subsequent encounter for closed fracture with routine healing: Secondary | ICD-10-CM | POA: Diagnosis not present

## 2024-06-14 DIAGNOSIS — G4700 Insomnia, unspecified: Secondary | ICD-10-CM | POA: Diagnosis not present

## 2024-06-14 DIAGNOSIS — E041 Nontoxic single thyroid nodule: Secondary | ICD-10-CM | POA: Diagnosis not present

## 2024-06-14 DIAGNOSIS — S72141D Displaced intertrochanteric fracture of right femur, subsequent encounter for closed fracture with routine healing: Secondary | ICD-10-CM | POA: Diagnosis not present

## 2024-06-14 DIAGNOSIS — Z9181 History of falling: Secondary | ICD-10-CM | POA: Diagnosis not present

## 2024-06-14 DIAGNOSIS — F0393 Unspecified dementia, unspecified severity, with mood disturbance: Secondary | ICD-10-CM | POA: Diagnosis not present

## 2024-06-14 DIAGNOSIS — K5900 Constipation, unspecified: Secondary | ICD-10-CM | POA: Diagnosis not present

## 2024-06-26 DIAGNOSIS — K5900 Constipation, unspecified: Secondary | ICD-10-CM | POA: Diagnosis not present

## 2024-06-26 DIAGNOSIS — Z9181 History of falling: Secondary | ICD-10-CM | POA: Diagnosis not present

## 2024-06-26 DIAGNOSIS — F0393 Unspecified dementia, unspecified severity, with mood disturbance: Secondary | ICD-10-CM | POA: Diagnosis not present

## 2024-06-26 DIAGNOSIS — G4700 Insomnia, unspecified: Secondary | ICD-10-CM | POA: Diagnosis not present

## 2024-06-27 DIAGNOSIS — R2689 Other abnormalities of gait and mobility: Secondary | ICD-10-CM | POA: Diagnosis not present

## 2024-06-28 DIAGNOSIS — M6281 Muscle weakness (generalized): Secondary | ICD-10-CM | POA: Diagnosis not present

## 2024-06-28 DIAGNOSIS — R2689 Other abnormalities of gait and mobility: Secondary | ICD-10-CM | POA: Diagnosis not present

## 2024-06-29 DIAGNOSIS — R2689 Other abnormalities of gait and mobility: Secondary | ICD-10-CM | POA: Diagnosis not present

## 2024-06-29 DIAGNOSIS — M6281 Muscle weakness (generalized): Secondary | ICD-10-CM | POA: Diagnosis not present

## 2024-06-30 DIAGNOSIS — R2689 Other abnormalities of gait and mobility: Secondary | ICD-10-CM | POA: Diagnosis not present

## 2024-06-30 DIAGNOSIS — M6281 Muscle weakness (generalized): Secondary | ICD-10-CM | POA: Diagnosis not present

## 2024-07-03 DIAGNOSIS — R2689 Other abnormalities of gait and mobility: Secondary | ICD-10-CM | POA: Diagnosis not present

## 2024-07-03 DIAGNOSIS — Z0100 Encounter for examination of eyes and vision without abnormal findings: Secondary | ICD-10-CM | POA: Diagnosis not present

## 2024-07-03 DIAGNOSIS — H5203 Hypermetropia, bilateral: Secondary | ICD-10-CM | POA: Diagnosis not present

## 2024-07-03 DIAGNOSIS — M6281 Muscle weakness (generalized): Secondary | ICD-10-CM | POA: Diagnosis not present

## 2024-07-04 DIAGNOSIS — R2689 Other abnormalities of gait and mobility: Secondary | ICD-10-CM | POA: Diagnosis not present

## 2024-07-04 DIAGNOSIS — M6281 Muscle weakness (generalized): Secondary | ICD-10-CM | POA: Diagnosis not present

## 2024-07-05 DIAGNOSIS — M6281 Muscle weakness (generalized): Secondary | ICD-10-CM | POA: Diagnosis not present

## 2024-07-05 DIAGNOSIS — R2689 Other abnormalities of gait and mobility: Secondary | ICD-10-CM | POA: Diagnosis not present

## 2024-07-06 DIAGNOSIS — R2689 Other abnormalities of gait and mobility: Secondary | ICD-10-CM | POA: Diagnosis not present

## 2024-07-06 DIAGNOSIS — M6281 Muscle weakness (generalized): Secondary | ICD-10-CM | POA: Diagnosis not present

## 2024-07-07 DIAGNOSIS — M6281 Muscle weakness (generalized): Secondary | ICD-10-CM | POA: Diagnosis not present

## 2024-07-07 DIAGNOSIS — R2689 Other abnormalities of gait and mobility: Secondary | ICD-10-CM | POA: Diagnosis not present

## 2024-07-10 DIAGNOSIS — R2689 Other abnormalities of gait and mobility: Secondary | ICD-10-CM | POA: Diagnosis not present

## 2024-07-10 DIAGNOSIS — M6281 Muscle weakness (generalized): Secondary | ICD-10-CM | POA: Diagnosis not present

## 2024-07-11 DIAGNOSIS — R2689 Other abnormalities of gait and mobility: Secondary | ICD-10-CM | POA: Diagnosis not present

## 2024-07-11 DIAGNOSIS — M6281 Muscle weakness (generalized): Secondary | ICD-10-CM | POA: Diagnosis not present

## 2024-07-12 DIAGNOSIS — R2689 Other abnormalities of gait and mobility: Secondary | ICD-10-CM | POA: Diagnosis not present

## 2024-07-12 DIAGNOSIS — M6281 Muscle weakness (generalized): Secondary | ICD-10-CM | POA: Diagnosis not present

## 2024-07-13 DIAGNOSIS — M6281 Muscle weakness (generalized): Secondary | ICD-10-CM | POA: Diagnosis not present

## 2024-07-13 DIAGNOSIS — R2689 Other abnormalities of gait and mobility: Secondary | ICD-10-CM | POA: Diagnosis not present

## 2024-07-14 DIAGNOSIS — M6281 Muscle weakness (generalized): Secondary | ICD-10-CM | POA: Diagnosis not present

## 2024-07-14 DIAGNOSIS — R2689 Other abnormalities of gait and mobility: Secondary | ICD-10-CM | POA: Diagnosis not present

## 2024-07-17 DIAGNOSIS — M1611 Unilateral primary osteoarthritis, right hip: Secondary | ICD-10-CM | POA: Diagnosis not present

## 2024-07-17 DIAGNOSIS — R2689 Other abnormalities of gait and mobility: Secondary | ICD-10-CM | POA: Diagnosis not present

## 2024-07-17 DIAGNOSIS — Z09 Encounter for follow-up examination after completed treatment for conditions other than malignant neoplasm: Secondary | ICD-10-CM | POA: Diagnosis not present

## 2024-07-17 DIAGNOSIS — M6281 Muscle weakness (generalized): Secondary | ICD-10-CM | POA: Diagnosis not present

## 2024-07-18 DIAGNOSIS — R2689 Other abnormalities of gait and mobility: Secondary | ICD-10-CM | POA: Diagnosis not present

## 2024-07-18 DIAGNOSIS — M6281 Muscle weakness (generalized): Secondary | ICD-10-CM | POA: Diagnosis not present

## 2024-07-19 DIAGNOSIS — F0393 Unspecified dementia, unspecified severity, with mood disturbance: Secondary | ICD-10-CM | POA: Diagnosis not present

## 2024-07-19 DIAGNOSIS — Z8781 Personal history of (healed) traumatic fracture: Secondary | ICD-10-CM | POA: Diagnosis not present

## 2024-07-19 DIAGNOSIS — M6281 Muscle weakness (generalized): Secondary | ICD-10-CM | POA: Diagnosis not present

## 2024-07-19 DIAGNOSIS — Z9181 History of falling: Secondary | ICD-10-CM | POA: Diagnosis not present

## 2024-07-19 DIAGNOSIS — R2689 Other abnormalities of gait and mobility: Secondary | ICD-10-CM | POA: Diagnosis not present

## 2024-07-19 DIAGNOSIS — K5900 Constipation, unspecified: Secondary | ICD-10-CM | POA: Diagnosis not present

## 2024-07-19 DIAGNOSIS — G4700 Insomnia, unspecified: Secondary | ICD-10-CM | POA: Diagnosis not present

## 2024-07-20 DIAGNOSIS — M6281 Muscle weakness (generalized): Secondary | ICD-10-CM | POA: Diagnosis not present

## 2024-07-20 DIAGNOSIS — R2689 Other abnormalities of gait and mobility: Secondary | ICD-10-CM | POA: Diagnosis not present

## 2024-07-21 DIAGNOSIS — M6281 Muscle weakness (generalized): Secondary | ICD-10-CM | POA: Diagnosis not present

## 2024-07-21 DIAGNOSIS — R2689 Other abnormalities of gait and mobility: Secondary | ICD-10-CM | POA: Diagnosis not present

## 2024-07-24 DIAGNOSIS — R2689 Other abnormalities of gait and mobility: Secondary | ICD-10-CM | POA: Diagnosis not present

## 2024-07-24 DIAGNOSIS — M6281 Muscle weakness (generalized): Secondary | ICD-10-CM | POA: Diagnosis not present

## 2024-07-26 DIAGNOSIS — M6281 Muscle weakness (generalized): Secondary | ICD-10-CM | POA: Diagnosis not present

## 2024-07-26 DIAGNOSIS — R2689 Other abnormalities of gait and mobility: Secondary | ICD-10-CM | POA: Diagnosis not present

## 2024-07-27 DIAGNOSIS — R2689 Other abnormalities of gait and mobility: Secondary | ICD-10-CM | POA: Diagnosis not present

## 2024-07-27 DIAGNOSIS — M6281 Muscle weakness (generalized): Secondary | ICD-10-CM | POA: Diagnosis not present

## 2024-07-28 DIAGNOSIS — R2689 Other abnormalities of gait and mobility: Secondary | ICD-10-CM | POA: Diagnosis not present

## 2024-07-28 DIAGNOSIS — M6281 Muscle weakness (generalized): Secondary | ICD-10-CM | POA: Diagnosis not present

## 2024-08-08 DIAGNOSIS — K5901 Slow transit constipation: Secondary | ICD-10-CM | POA: Diagnosis not present

## 2024-08-08 DIAGNOSIS — G301 Alzheimer's disease with late onset: Secondary | ICD-10-CM | POA: Diagnosis not present

## 2024-08-08 DIAGNOSIS — F02818 Dementia in other diseases classified elsewhere, unspecified severity, with other behavioral disturbance: Secondary | ICD-10-CM | POA: Diagnosis not present

## 2024-08-17 DIAGNOSIS — Z09 Encounter for follow-up examination after completed treatment for conditions other than malignant neoplasm: Secondary | ICD-10-CM | POA: Diagnosis not present

## 2024-08-17 DIAGNOSIS — Z96643 Presence of artificial hip joint, bilateral: Secondary | ICD-10-CM | POA: Diagnosis not present

## 2024-08-17 DIAGNOSIS — M25551 Pain in right hip: Secondary | ICD-10-CM | POA: Diagnosis not present

## 2024-08-18 DIAGNOSIS — Z8781 Personal history of (healed) traumatic fracture: Secondary | ICD-10-CM | POA: Diagnosis not present

## 2024-08-18 DIAGNOSIS — G301 Alzheimer's disease with late onset: Secondary | ICD-10-CM | POA: Diagnosis not present

## 2024-08-18 DIAGNOSIS — F0390 Unspecified dementia without behavioral disturbance: Secondary | ICD-10-CM | POA: Diagnosis not present

## 2024-08-18 DIAGNOSIS — E559 Vitamin D deficiency, unspecified: Secondary | ICD-10-CM | POA: Diagnosis not present

## 2024-08-18 DIAGNOSIS — K5900 Constipation, unspecified: Secondary | ICD-10-CM | POA: Diagnosis not present

## 2024-08-18 DIAGNOSIS — Z96641 Presence of right artificial hip joint: Secondary | ICD-10-CM | POA: Diagnosis not present

## 2024-08-19 DIAGNOSIS — E1122 Type 2 diabetes mellitus with diabetic chronic kidney disease: Secondary | ICD-10-CM | POA: Diagnosis not present

## 2024-08-19 DIAGNOSIS — E559 Vitamin D deficiency, unspecified: Secondary | ICD-10-CM | POA: Diagnosis not present

## 2024-09-08 DIAGNOSIS — G4700 Insomnia, unspecified: Secondary | ICD-10-CM | POA: Diagnosis not present

## 2024-09-08 DIAGNOSIS — F0390 Unspecified dementia without behavioral disturbance: Secondary | ICD-10-CM | POA: Diagnosis not present

## 2024-09-11 DIAGNOSIS — E559 Vitamin D deficiency, unspecified: Secondary | ICD-10-CM | POA: Diagnosis not present

## 2024-09-11 DIAGNOSIS — I10 Essential (primary) hypertension: Secondary | ICD-10-CM | POA: Diagnosis not present

## 2024-09-11 DIAGNOSIS — K5901 Slow transit constipation: Secondary | ICD-10-CM | POA: Diagnosis not present

## 2024-09-11 DIAGNOSIS — F02818 Dementia in other diseases classified elsewhere, unspecified severity, with other behavioral disturbance: Secondary | ICD-10-CM | POA: Diagnosis not present

## 2024-09-11 DIAGNOSIS — F03918 Unspecified dementia, unspecified severity, with other behavioral disturbance: Secondary | ICD-10-CM | POA: Diagnosis not present

## 2024-09-13 DIAGNOSIS — F5101 Primary insomnia: Secondary | ICD-10-CM | POA: Diagnosis not present

## 2024-09-13 DIAGNOSIS — E041 Nontoxic single thyroid nodule: Secondary | ICD-10-CM | POA: Diagnosis not present

## 2024-09-13 DIAGNOSIS — F0390 Unspecified dementia without behavioral disturbance: Secondary | ICD-10-CM | POA: Diagnosis not present
